# Patient Record
Sex: Female | Born: 1950 | Race: White | Hispanic: No | Smoking: Never smoker
Health system: Southern US, Community
[De-identification: ages and names within clinical notes are randomized; demographics above are authoritative.]

## PROBLEM LIST (undated history)

## (undated) DIAGNOSIS — E079 Disorder of thyroid, unspecified: Secondary | ICD-10-CM

## (undated) DIAGNOSIS — IMO0001 Reserved for inherently not codable concepts without codable children: Secondary | ICD-10-CM

## (undated) DIAGNOSIS — Z86010 Personal history of colonic polyps: Secondary | ICD-10-CM

## (undated) DIAGNOSIS — Z860101 Personal history of adenomatous and serrated colon polyps: Secondary | ICD-10-CM

## (undated) HISTORY — DX: Reserved for inherently not codable concepts without codable children: IMO0001

## (undated) HISTORY — PX: ABDOMINAL HYSTERECTOMY: SHX81

## (undated) HISTORY — DX: Personal history of colonic polyps: Z86.010

## (undated) HISTORY — DX: Personal history of adenomatous and serrated colon polyps: Z86.0101

## (undated) HISTORY — PX: COLONOSCOPY: SHX174

## (undated) HISTORY — DX: Disorder of thyroid, unspecified: E07.9

---

## 2000-03-11 ENCOUNTER — Emergency Department (HOSPITAL_COMMUNITY): Admission: EM | Admit: 2000-03-11 | Discharge: 2000-03-11 | Payer: Self-pay | Admitting: *Deleted

## 2001-01-21 ENCOUNTER — Emergency Department (HOSPITAL_COMMUNITY): Admission: EM | Admit: 2001-01-21 | Discharge: 2001-01-21 | Payer: Self-pay | Admitting: Emergency Medicine

## 2001-02-11 ENCOUNTER — Encounter: Admission: RE | Admit: 2001-02-11 | Discharge: 2001-02-11 | Payer: Self-pay | Admitting: Family Medicine

## 2001-02-11 ENCOUNTER — Encounter: Payer: Self-pay | Admitting: Family Medicine

## 2005-11-30 ENCOUNTER — Encounter: Admission: RE | Admit: 2005-11-30 | Discharge: 2005-12-09 | Payer: Self-pay | Admitting: Family Medicine

## 2005-12-10 ENCOUNTER — Encounter: Admission: RE | Admit: 2005-12-10 | Discharge: 2006-01-29 | Payer: Self-pay | Admitting: Family Medicine

## 2006-06-01 ENCOUNTER — Ambulatory Visit (HOSPITAL_COMMUNITY): Admission: RE | Admit: 2006-06-01 | Discharge: 2006-06-01 | Payer: Self-pay | Admitting: Obstetrics and Gynecology

## 2007-08-02 ENCOUNTER — Ambulatory Visit: Payer: Self-pay | Admitting: Vascular Surgery

## 2008-07-06 ENCOUNTER — Encounter: Admission: RE | Admit: 2008-07-06 | Discharge: 2008-07-06 | Payer: Self-pay | Admitting: Family Medicine

## 2011-01-24 NOTE — Consult Note (Signed)
NEW PATIENT CONSULTATION   Johnson, Mandy A  DOB:  [birth year]                                       08/02/2007  XBJYN#:[id number]   Mandy Johnson presents today for evaluation of lower extremity venous  pathology.  She is a healthy 60 year old white female with concern  regarding varicosities in both lower extremities.  She has no history of  bleeding from these.  No history of deep venous thrombosis or  superficial thrombophlebitis.  She does report some heaviness and  discomfort over the head of the thigh.  She does report some heaviness  and discomfort over the lateral aspect of her left calf with prolonged  standing.  She reports that these initially occurred during first  pregnancy, and has not had any marked progression since that time.   PAST HISTORY:  1. Hypothyroidism.  2. She does have a history of hysterectomy in 1985.   Otherwise, no major medical difficulties.   FAMILY HISTORY:  Negative for premature atherosclerotic disease.   SOCIAL HISTORY:  She is married with 2 children.  She does not smoke.  Has occasional alcohol; social consumption.   REVIEW OF SYSTEMS:  Totally negative aside from discomfort in her legs.   MEDICATION ALLERGIES:  Penicillin.   MEDICATIONS:  Levothyroxine 88 mcg daily.   PHYSICAL EXAM:  Well-developed, well-nourished white female, appearing  stated age, 60.  Blood pressure 139/91, pulse 81, respirations 16.  Her dorsalis pedis pulse in 2+ bilaterally.  She does not have any  marked varicosities in her saphenous system.  On left leg, she does have  an aberrant varicosity running across the anteromedial thigh, lateral  knee, and lateral calf.  Her left leg, she does have a prominent venous  varix in her mid posterior calf.  She does not have any evidence of  superficial thrombophlebitis.   She underwent a hand-held duplex by me.  This revealed connection of the  aberrant vein in her left thigh to her saphenous vein in the  medial  thigh.  Her saphenous vein itself does not appear to be enlarged, and I  do not see any evidence of gross reflux.  Her right leg small saphenous  vein is patent and normal size, and this varix in her right posterior  calf does arise from the small saphenous vein.  I had a long discussion  with Ms. Knab.  I explained that this does not put her at any increased  risk for progression through more serious complications related to  venous pathology; specifically deep venous thrombosis.  She currently is  having mild symptoms only related to the left lateral varix.  I  explained that if he were going to proceed with treatment, that we would  do a formal duplex to determine if she did have saphenous vein  incompetence in her left thigh, and if so would treat this with laser  ablation, followed by either stab phlebectomy or spare therapy for the  vein in her thigh.  She currently is comfortable with observation only,  and reassurance that this does not put her at increased risk for more  serious complications.  She will notify should she wish further  treatment in the future, and will be seen on an as-needed basis.   Larina Earthly, M.D.  Electronically Signed   TFE/MEDQ  D:  08/02/2007  T:  08/05/2007  Job:  [id number]   cc:   Duke Salvia. Marcelle Overlie, M.D.

## 2011-01-27 NOTE — Op Note (Signed)
[name], Mandy Johnson                   ACCOUNT NO.:  [id number]   MEDICAL RECORD NO.:  [id number]          PATIENT TYPE:  AMB   LOCATION:  [facility]                           FACILITY:  [facility]   PHYSICIAN:  Duke Salvia. Marcelle Overlie, M.D.DATE OF BIRTH:  11-Dec-1950   DATE OF PROCEDURE:  06/01/2006  DATE OF DISCHARGE:  06/01/2006                                 OPERATIVE REPORT   PREOPERATIVE DIAGNOSIS:  Recurrent right Bartholin cyst/abscess.   POSTOPERATIVE DIAGNOSIS:  Recurrent right Bartholin cyst/abscess.   PROCEDURE:  Marsupialization of right Bartholin gland cyst/abscess.   SURGEON:  Duke Salvia. Marcelle Overlie, M.D.   ANESTHESIA:  Local plus MAC.   SPECIMENS REMOVED:  None.   PROCEDURE AND FINDINGS:  The patient was taken to the operating room.  After  an adequate level of sedation was obtained with the patient legs in  stirrups, the perineum and vagina were prepped and draped with Hibiclens.  The right Bartholin's was noted be enlarged approximately 3 cm.  It had been  previously drained with a Word catheter placed.  The incision site further  for the Word catheter placement could be seen to be draining small amount of  mucopurulent material.  Marcaine 0.5% was used to infiltrate the area.  A  #11 blade was used to drain the cyst, which also had some minimal purulent  material left over.  This was scored with a hemostat.  The cyst wall was  then circumferentially sutured with 3-0 Vicryl Rapide sutures to the  surrounding skin.  Plain gauze was packed into the incision site, and she  will receive oral antibiotics to take at home for a few days.  She tolerated  this well and went to the recovery room in good condition.      Richard M. Marcelle Overlie, M.D.  Electronically Signed     RMH/MEDQ  D:  06/01/2006  T:  06/03/2006  Job:  [id number]

## 2011-12-05 ENCOUNTER — Encounter: Payer: Self-pay | Admitting: *Deleted

## 2011-12-06 ENCOUNTER — Encounter: Payer: Self-pay | Admitting: Vascular Surgery

## 2011-12-06 ENCOUNTER — Ambulatory Visit (INDEPENDENT_AMBULATORY_CARE_PROVIDER_SITE_OTHER): Payer: BC Managed Care – PPO | Admitting: *Deleted

## 2011-12-06 DIAGNOSIS — I83893 Varicose veins of bilateral lower extremities with other complications: Secondary | ICD-10-CM

## 2011-12-06 NOTE — Progress Notes (Signed)
Patient came for sclerotherapy but presented with a large reticular vein that branches off the left GSV (per Dr. Bosie Helper note from a previous visit). I showed Dr. Edilia Bo the vein and he suggested she have a duplex ultrasound to rule out reflux. The patient is much more comfortable doing this vs. Injecting and having more veins appear later. She does have pain, itching , and heaviness in the left leg more than the right. The right leg also has a reticular that goes up the side of her leg from the calf to the thigh. She also reports that she had a  Bleeding episode recently that occurred on the left in the middle of the large reticular and presented spontaneously, and as a large, painful bruise. She doesn't want to waste money on doing sclerotherapy if there is reflux. I fitted her for thigh high graduated compression 20-30 mmHG stockings today and she will have the reflux study done next Monday 12/11/11.

## 2011-12-11 ENCOUNTER — Encounter (INDEPENDENT_AMBULATORY_CARE_PROVIDER_SITE_OTHER): Payer: BC Managed Care – PPO | Admitting: *Deleted

## 2011-12-11 DIAGNOSIS — I83893 Varicose veins of bilateral lower extremities with other complications: Secondary | ICD-10-CM

## 2011-12-18 NOTE — Procedures (Unsigned)
LOWER EXTREMITY VENOUS REFLUX EXAM  INDICATION:  Varicose vein with other complication.  EXAM:  Using color-flow imaging and pulse Doppler spectral analysis, the bilateral common femoral, femoral, popliteal, posterior tibial, great and small saphenous veins were evaluated.  There is evidence suggesting deep venous insufficiency in the bilateral common femoral veins.  The bilateral saphenofemoral junctions and non-tortuous great saphenous veins demonstrate competency.  The right anterior accessory saphenous vein demonstrates Reflux of>500 milliseconds with diameter measurements ranging from 0.31 to 0.35 cm in the proximal to mid thigh level.  The bilateral proximal small saphenous veins demonstrate competency.  GSV Diameter (used if found to be incompetent only)                                           Right    Left Proximal Greater Saphenous Vein           cm       cm Proximal-to-mid-thigh                     cm       cm Mid thigh                                 cm       cm Mid-distal thigh                          cm       cm Distal thigh                              cm       cm Knee                                      cm       cm  IMPRESSION: 1. Reflux of>500 milliseconds noted in the right anterior accessory     saphenous and bilateral common femoral veins. 2. No reflux is noted in the bilateral great and small saphenous     veins.  ___________________________________________ Quita Skye Hart [place], M.D.  CH/MEDQ  D:  12/13/2011  T:  12/13/2011  Job:  [id number]

## 2011-12-19 ENCOUNTER — Encounter: Payer: Self-pay | Admitting: *Deleted

## 2011-12-20 ENCOUNTER — Ambulatory Visit (INDEPENDENT_AMBULATORY_CARE_PROVIDER_SITE_OTHER): Payer: BC Managed Care – PPO | Admitting: *Deleted

## 2011-12-20 DIAGNOSIS — I781 Nevus, non-neoplastic: Secondary | ICD-10-CM

## 2011-12-20 DIAGNOSIS — I83893 Varicose veins of bilateral lower extremities with other complications: Secondary | ICD-10-CM

## 2011-12-20 NOTE — Progress Notes (Signed)
X=>5% for large reticulars and >3% for spiders Sotradecol administered with a 27g butterfly.  Patient received a total of 24cc. Patient had her Duplex which showed some reflux in both deep systems. Both GSVs are competent. Right ant accessory saphenous has reflux but vein diameter is small. Not a candidate for laser ablation. Is a candidate for sclero. Treated all her retics and spiders. Tol well. Anticipate good results for this nice lady. Will follow prn.  Photos: yes  Compression stockings applied: yes and aces over retics.

## 2011-12-21 ENCOUNTER — Encounter: Payer: Self-pay | Admitting: Vascular Surgery

## 2012-04-16 ENCOUNTER — Other Ambulatory Visit: Payer: Self-pay | Admitting: Obstetrics and Gynecology

## 2012-04-16 DIAGNOSIS — R928 Other abnormal and inconclusive findings on diagnostic imaging of breast: Secondary | ICD-10-CM

## 2012-04-22 ENCOUNTER — Ambulatory Visit
Admission: RE | Admit: 2012-04-22 | Discharge: 2012-04-22 | Disposition: A | Discharge status: Home / Self Care | Payer: BC Managed Care – PPO | Source: Ambulatory Visit | Attending: Obstetrics and Gynecology | Admitting: Obstetrics and Gynecology

## 2012-04-22 DIAGNOSIS — R928 Other abnormal and inconclusive findings on diagnostic imaging of breast: Secondary | ICD-10-CM

## 2012-10-08 ENCOUNTER — Encounter: Payer: Self-pay | Admitting: *Deleted

## 2012-10-09 ENCOUNTER — Ambulatory Visit (INDEPENDENT_AMBULATORY_CARE_PROVIDER_SITE_OTHER): Payer: BC Managed Care – PPO | Admitting: *Deleted

## 2012-10-09 DIAGNOSIS — I781 Nevus, non-neoplastic: Secondary | ICD-10-CM

## 2012-10-09 NOTE — Progress Notes (Signed)
X=.7% Sotradecol administered with a 27g butterfly.  Patient received a total of 6cc.  She only wanted me to inject the large reticular on the left lateral calf. Increased the concentration again (.5% last time). Easy access. Vein is large. Pt very realistic. Follow prn.  Photos: yes  Compression stockings applied: yes and an ace

## 2012-11-13 ENCOUNTER — Other Ambulatory Visit: Payer: Self-pay | Admitting: Obstetrics and Gynecology

## 2012-11-13 DIAGNOSIS — R921 Mammographic calcification found on diagnostic imaging of breast: Secondary | ICD-10-CM

## 2012-12-05 ENCOUNTER — Ambulatory Visit
Admission: RE | Admit: 2012-12-05 | Discharge: 2012-12-05 | Disposition: A | Discharge status: Home / Self Care | Payer: BC Managed Care – PPO | Source: Ambulatory Visit | Attending: Obstetrics and Gynecology | Admitting: Obstetrics and Gynecology

## 2012-12-05 DIAGNOSIS — R921 Mammographic calcification found on diagnostic imaging of breast: Secondary | ICD-10-CM

## 2013-09-01 ENCOUNTER — Other Ambulatory Visit: Payer: Self-pay | Admitting: Obstetrics and Gynecology

## 2013-09-01 DIAGNOSIS — R921 Mammographic calcification found on diagnostic imaging of breast: Secondary | ICD-10-CM

## 2013-09-15 ENCOUNTER — Ambulatory Visit
Admission: RE | Admit: 2013-09-15 | Discharge: 2013-09-15 | Disposition: A | Discharge status: Home / Self Care | Payer: BC Managed Care – PPO | Source: Ambulatory Visit | Attending: Obstetrics and Gynecology | Admitting: Obstetrics and Gynecology

## 2013-09-15 DIAGNOSIS — R921 Mammographic calcification found on diagnostic imaging of breast: Secondary | ICD-10-CM

## 2013-11-18 ENCOUNTER — Encounter: Payer: Self-pay | Admitting: *Deleted

## 2013-11-19 ENCOUNTER — Ambulatory Visit (INDEPENDENT_AMBULATORY_CARE_PROVIDER_SITE_OTHER): Payer: Self-pay | Admitting: *Deleted

## 2013-11-19 DIAGNOSIS — I781 Nevus, non-neoplastic: Secondary | ICD-10-CM

## 2013-11-19 NOTE — Progress Notes (Signed)
X=.3% Sotradecol administered with a 27g butterfly.  Patient received a total of 5cc.  Treated new vessels. She only wanted one syringe. Great results since previous tx of her reticulars. She's very happy. Tol well. Anticipate good results. Follow prn.  Photos: yes  Compression stockings applied: yes

## 2014-11-30 ENCOUNTER — Other Ambulatory Visit: Payer: Self-pay

## 2014-11-30 DIAGNOSIS — Z1231 Encounter for screening mammogram for malignant neoplasm of breast: Secondary | ICD-10-CM

## 2014-12-03 ENCOUNTER — Ambulatory Visit
Admission: RE | Admit: 2014-12-03 | Discharge: 2014-12-03 | Disposition: A | Discharge status: Home / Self Care | Payer: BLUE CROSS/BLUE SHIELD | Source: Ambulatory Visit

## 2014-12-03 DIAGNOSIS — Z1231 Encounter for screening mammogram for malignant neoplasm of breast: Secondary | ICD-10-CM

## 2015-10-07 ENCOUNTER — Other Ambulatory Visit: Payer: Self-pay

## 2015-10-07 DIAGNOSIS — Z1231 Encounter for screening mammogram for malignant neoplasm of breast: Secondary | ICD-10-CM

## 2015-12-06 ENCOUNTER — Ambulatory Visit
Admission: RE | Admit: 2015-12-06 | Discharge: 2015-12-06 | Disposition: A | Discharge status: Home / Self Care | Payer: Medicare Other | Source: Ambulatory Visit

## 2015-12-06 DIAGNOSIS — Z1231 Encounter for screening mammogram for malignant neoplasm of breast: Secondary | ICD-10-CM | POA: Diagnosis not present

## 2015-12-21 DIAGNOSIS — N951 Menopausal and female climacteric states: Secondary | ICD-10-CM | POA: Diagnosis not present

## 2015-12-21 DIAGNOSIS — E039 Hypothyroidism, unspecified: Secondary | ICD-10-CM | POA: Diagnosis not present

## 2015-12-21 DIAGNOSIS — Z23 Encounter for immunization: Secondary | ICD-10-CM | POA: Diagnosis not present

## 2015-12-21 DIAGNOSIS — E7800 Pure hypercholesterolemia, unspecified: Secondary | ICD-10-CM | POA: Diagnosis not present

## 2015-12-21 DIAGNOSIS — Z0000 Encounter for general adult medical examination without abnormal findings: Secondary | ICD-10-CM | POA: Diagnosis not present

## 2016-01-11 DIAGNOSIS — M8589 Other specified disorders of bone density and structure, multiple sites: Secondary | ICD-10-CM | POA: Diagnosis not present

## 2016-01-11 DIAGNOSIS — Z780 Asymptomatic menopausal state: Secondary | ICD-10-CM | POA: Diagnosis not present

## 2016-03-10 DIAGNOSIS — H40011 Open angle with borderline findings, low risk, right eye: Secondary | ICD-10-CM | POA: Diagnosis not present

## 2016-03-10 DIAGNOSIS — H40012 Open angle with borderline findings, low risk, left eye: Secondary | ICD-10-CM | POA: Diagnosis not present

## 2016-06-16 DIAGNOSIS — Z23 Encounter for immunization: Secondary | ICD-10-CM | POA: Diagnosis not present

## 2016-06-23 DIAGNOSIS — E7800 Pure hypercholesterolemia, unspecified: Secondary | ICD-10-CM | POA: Diagnosis not present

## 2016-08-09 ENCOUNTER — Encounter: Payer: Self-pay | Admitting: *Deleted

## 2016-08-16 ENCOUNTER — Other Ambulatory Visit: Payer: Self-pay | Admitting: *Deleted

## 2016-08-16 ENCOUNTER — Ambulatory Visit: Payer: Medicare Other | Admitting: *Deleted

## 2016-08-16 DIAGNOSIS — I83893 Varicose veins of bilateral lower extremities with other complications: Secondary | ICD-10-CM

## 2016-08-16 NOTE — Progress Notes (Signed)
Patient came in for her 4th sclerotherapy session with me. Her last being in 2015 where initially she had good results but new spiders have shown up and now there is a large bulging varicose vein which runs from her left inner thigh over her knee and down the left outer side. Looking with the sonosite, reflux was seen in the L GSV. Dr. Scot Dock suggested a bilateral reflux study since there are new bulges on the right as well and a visit with Dr. Kellie Simmering. We discussed beginning the three months of conservative therapy which includes wearing her thigh high 20-30 compression stockings, elevation and Ibuprofen. Pt has minimal swelling presently but does have heaviness and pain especially in the left leg. Her CEAP classification is 2. Appts made for 09/18/16. Will follow prn.

## 2016-08-17 NOTE — Addendum Note (Signed)
Addended by: Lianne Cure A on: 08/17/2016 10:07 AM   Modules accepted: Orders

## 2016-09-01 ENCOUNTER — Encounter: Payer: Self-pay | Admitting: Vascular Surgery

## 2016-09-18 ENCOUNTER — Ambulatory Visit (HOSPITAL_COMMUNITY): Admission: RE | Admit: 2016-09-18 | Payer: Medicare Other | Source: Ambulatory Visit

## 2016-09-18 ENCOUNTER — Encounter: Payer: Medicare Other | Admitting: Vascular Surgery

## 2016-09-26 ENCOUNTER — Encounter: Payer: Self-pay | Admitting: Vascular Surgery

## 2016-10-02 ENCOUNTER — Ambulatory Visit (HOSPITAL_COMMUNITY)
Admission: RE | Admit: 2016-10-02 | Discharge: 2016-10-02 | Disposition: A | Discharge status: Home / Self Care | Payer: Medicare Other | Source: Ambulatory Visit | Attending: Vascular Surgery | Admitting: Vascular Surgery

## 2016-10-02 ENCOUNTER — Ambulatory Visit (INDEPENDENT_AMBULATORY_CARE_PROVIDER_SITE_OTHER): Payer: Medicare Other | Admitting: Vascular Surgery

## 2016-10-02 ENCOUNTER — Encounter: Payer: Self-pay | Admitting: Vascular Surgery

## 2016-10-02 VITALS — BP 127/77 | HR 62 | Temp 98.6°F | Resp 16 | Ht 69.0 in | Wt 150.0 lb

## 2016-10-02 DIAGNOSIS — I83893 Varicose veins of bilateral lower extremities with other complications: Secondary | ICD-10-CM | POA: Insufficient documentation

## 2016-10-02 DIAGNOSIS — I8393 Asymptomatic varicose veins of bilateral lower extremities: Secondary | ICD-10-CM | POA: Insufficient documentation

## 2016-10-02 DIAGNOSIS — I872 Venous insufficiency (chronic) (peripheral): Secondary | ICD-10-CM | POA: Diagnosis not present

## 2016-10-02 NOTE — Progress Notes (Signed)
Subjective:     Patient ID: Mandy Johnson, female   DOB: [birth year], 66 y.o.   MRN: [id number]  HPI This 66 year old female has been previously seen in our office by Thea Silversmith in 2015. She had 3 courses of sclerotherapy for spider veins in both lower extremities. She had an excellent early results. Recently when she was doing some yardwork she noticed a bulge in the left patellar region which happened suddenly she states. She has no history of DVT thrombophlebitis stasis ulcers or bleeding. She's also noticed increasing spider veins in the left leg. She has a bulge in the right posterior calf as well. She is concerned about whether this could cause blood clots.  Past Medical History:  Diagnosis Date  . Thyroid disease   . 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 Smoking status: Never Smoker  . Smokeless tobacco: Never Used  . Alcohol use Yes    Family History  Problem Relation Age of Onset  . Cancer Mother   . Heart disease Father     Allergies  Allergen Reactions  . Iodine Rash  . Penicillins Rash     Current Outpatient Prescriptions:  .  levothyroxine (SYNTHROID, LEVOTHROID) 100 MCG tablet, Take 100 mcg by mouth daily before breakfast., Disp: , Rfl:   Vitals:   10/02/16 1141  BP: 127/77  Pulse: 62  Resp: 16  Temp: 98.6 F (37 C)  SpO2: 100%  Weight: 150 lb (68 kg)  Height: 5\' 9"  (1.753 m)    Body mass index is 22.15 kg/m.         Review of Systems Denies chest pain, dyspnea on exertion, PND, orthopnea, hemoptysis. Does have tinnitus which causes occasional dizziness. No history of CVA. Other systems negative and complete review of systems    Objective:   Physical Exam BP 127/77   Pulse 62   Temp 98.6 F (37 C)   Resp 16   Ht 5\' 9"  (1.753 m)   Wt 150 lb (68 kg)   SpO2 100%   BMI 22.15 kg/m     Gen.-alert and oriented x3 in no apparent distress HEENT normal for age Lungs no rhonchi or  wheezing Cardiovascular regular rhythm no murmurs carotid pulses 3+ palpable no bruits audible Abdomen soft nontender no palpable masses Musculoskeletal free of  major deformities Skin clear -no rashes Neurologic normal Lower extremities 3+ femoral and dorsalis pedis pulses palpable bilaterally with no edema Right leg with focal varicosity mid posterior calf about one half centimeters in diameter. A few spider veins right medial and lateral thigh Left leg with varicosity beginning in distal thigh extending down across patella. Scattered spider veins medial and lateral thigh and calf on the left. No hyperpigmentation or ulceration or edema on either lower extremity.  Today I ordered a venous duplex exam the left leg which I reviewed and interpreted. There is gross reflux in the left great saphenous vein but it is a small caliber vein. It does supply the varicosity in the left distal thigh. I performed a bedside SonoSite independent ultrasound exam. The right small saphenous vein is very small and it appears that the isolated varix is originating from an incompetent perforator. The left great saphenous vein does appear to small for laser ablation       Assessment:     #1 scattered spider veins and a few small varicosities and right posterior calf and left distal thigh and  patellar region #2 history of tinnitus    Plan:     Patient not a candidate for laser ablation of great or small saphenous veins Treatment option would be foam sclerotherapy. We did discuss this treatment option with her and she will consider it Continue treatment of 10 of this as she has been doingpre- scribed by her medical doctor Otherwise return to see me on a when necessary basis

## 2016-10-11 ENCOUNTER — Other Ambulatory Visit: Payer: Self-pay | Admitting: Family Medicine

## 2016-10-11 DIAGNOSIS — Z1231 Encounter for screening mammogram for malignant neoplasm of breast: Secondary | ICD-10-CM

## 2016-11-01 ENCOUNTER — Encounter: Payer: Self-pay | Admitting: *Deleted

## 2016-11-08 ENCOUNTER — Ambulatory Visit: Payer: Medicare Other | Admitting: *Deleted

## 2016-11-08 DIAGNOSIS — I83893 Varicose veins of bilateral lower extremities with other complications: Secondary | ICD-10-CM

## 2016-11-08 NOTE — Progress Notes (Signed)
Patient came for sclerotherapy but had concerns whether or not this is the best treatment for her veins. She came 5 weeks ago for a reflux study that showed reflux in the L GSV but the vein diameter was .39 and .37cm in the proximal and mid thigh, so too small for insurance approval of a laser ablation. She says her varicose veins that run across her knee and down the calf continue to get bigger and the discomfort is worsening. She would like to repeat the study in 6 months to see if the vein diameter has increased. She wears thigh high graduated compression stockings daily, tries to elevate when possible and takes Ibuprofen when the pain is really bad. I told her to continue with that conservative therapy. Appts were made for the repeat reflux study and a visit with Dr. Donnetta Hutching (because his schedule is a bit more open then Dr. Evelena Leyden). We decided to postpone her sclerotherapy appt until after she sees Dr. Donnetta Hutching.

## 2016-11-14 NOTE — Addendum Note (Signed)
Addended by: Lianne Cure A on: 11/14/2016 03:01 PM   Modules accepted: Orders

## 2016-12-06 ENCOUNTER — Ambulatory Visit: Payer: Medicare Other

## 2016-12-06 ENCOUNTER — Ambulatory Visit
Admission: RE | Admit: 2016-12-06 | Discharge: 2016-12-06 | Disposition: A | Discharge status: Home / Self Care | Payer: Medicare Other | Source: Ambulatory Visit | Attending: Family Medicine | Admitting: Family Medicine

## 2016-12-06 DIAGNOSIS — Z1231 Encounter for screening mammogram for malignant neoplasm of breast: Secondary | ICD-10-CM

## 2016-12-20 ENCOUNTER — Ambulatory Visit: Payer: Medicare Other | Admitting: *Deleted

## 2016-12-26 DIAGNOSIS — Z0000 Encounter for general adult medical examination without abnormal findings: Secondary | ICD-10-CM | POA: Diagnosis not present

## 2016-12-26 DIAGNOSIS — E039 Hypothyroidism, unspecified: Secondary | ICD-10-CM | POA: Diagnosis not present

## 2016-12-26 DIAGNOSIS — R42 Dizziness and giddiness: Secondary | ICD-10-CM | POA: Diagnosis not present

## 2016-12-26 DIAGNOSIS — E7800 Pure hypercholesterolemia, unspecified: Secondary | ICD-10-CM | POA: Diagnosis not present

## 2016-12-26 DIAGNOSIS — Z23 Encounter for immunization: Secondary | ICD-10-CM | POA: Diagnosis not present

## 2017-01-02 ENCOUNTER — Ambulatory Visit: Payer: Medicare Other | Admitting: Vascular Surgery

## 2017-01-04 ENCOUNTER — Ambulatory Visit: Payer: Medicare Other | Attending: Family Medicine | Admitting: Rehabilitative and Restorative Service Providers"

## 2017-01-04 DIAGNOSIS — R2681 Unsteadiness on feet: Secondary | ICD-10-CM

## 2017-01-04 DIAGNOSIS — R2689 Other abnormalities of gait and mobility: Secondary | ICD-10-CM | POA: Insufficient documentation

## 2017-01-04 DIAGNOSIS — R42 Dizziness and giddiness: Secondary | ICD-10-CM | POA: Insufficient documentation

## 2017-01-04 DIAGNOSIS — H8112 Benign paroxysmal vertigo, left ear: Secondary | ICD-10-CM | POA: Insufficient documentation

## 2017-01-04 NOTE — Patient Instructions (Signed)
Gaze Stabilization - Tip Card  1.Target must remain in focus, not blurry, and appear stationary while head is in motion. 2.Perform exercises with small head movements (45 to either side of midline). 3.Increase speed of head motion so long as target is in focus. 4.If you wear eyeglasses, be sure you can see target through lens (therapist will give specific instructions for bifocal / progressive lenses). 5.These exercises may provoke dizziness or nausea. Work through these symptoms. If too dizzy, slow head movement slightly. Rest between each exercise. 6.Exercises demand concentration; avoid distractions. 7.For safety, perform standing exercises close to a counter, wall, corner, or next to someone.  Copyright  VHI. All rights reserved.   Gaze Stabilization - Standing Feet Apart   Feet shoulder width apart, keeping eyes on target on wall 3 feet away, tilt head down slightly and move head side to side for 30 seconds. Repeat while moving head up and down for 30 seconds. *Work up to tolerating 60 seconds, as able. Do 2-3 sessions per day.   Copyright  VHI. All rights reserved.   Feet Heel-Toe "Tandem", Varied Arm Positions - Eyes Open    With eyes open, right foot directly in front of the other, arms out, look straight ahead at a stationary object. Hold __30__ seconds and then switch feet.  Repeat _3___ times per session. Do __2__ sessions per day.  Copyright  VHI. All rights reserved.   Feet Apart (Compliant Surface) Varied Arm Positions - Eyes Closed    Stand on compliant surface: __pillow______ with feet shoulder width apart and arms out. Close eyes and visualize upright position. Hold_10-15___ seconds. Repeat __3__ times per session. Do __2__ sessions per day.  Copyright  VHI. All rights reserved.

## 2017-01-04 NOTE — Therapy (Signed)
[facility] [address] [place], [place], [address] Phone: [phone]   Fax:  [phone]  Physical Therapy Treatment  Patient Details  Name: Mandy Johnson MRN: [id number] Date of Birth: Aug 29, 1951 Referring Provider: Donnie Coffin, MD  Encounter Date: 01/04/2017      PT End of Session - 01/04/17 1617    Visit Number 1   Number of Visits 8   Date for PT Re-Evaluation 03/05/17   Authorization Type G code every 10th visit   PT Start Time 0933   PT Stop Time 1020   PT Time Calculation (min) 47 min   Activity Tolerance Patient tolerated treatment well   Behavior During Therapy [facility] for tasks assessed/performed      Past Medical History:  Diagnosis Date  . Thyroid disease   . 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 ABDOMINAL HYSTERECTOMY      There were no vitals filed for this visit.      Subjective Assessment - 01/04/17 0938    Subjective The patient notes ongoing sensation of vertigo with first episode in her early 66s (approximately 15 years ago).  She said the initial symptoms she worked out, but it took years.  She reports she used to feel like she was walking on a slant and that has improved.  "Now it's part of my life."   The symptoms are keeping her from working "I'm always aware of it".  Her last attack was 2.5 years ago and she was lying on her left side--it lasted for seconds, but it left her feeling off balance and unstable.  She no longer sleeps lying down.  Sensation overall lasted about a week and she had to avoid driving/getting out of her house.    Pertinent History h/o goiter, thyroid disease, some mild discomfort behind left ear.   H/o migraines since she was 11 (visual aura lasts 20-45 minutes, headache can last a day--has to sleep for it to go away).  Improved with age/  notes some fogginess x hours.    Patient Stated Goals Figure out some  exercises to help improve dizziness.    Currently in Pain? No/denies            [facility] PT Assessment - 01/04/17 0943      Assessment   Medical Diagnosis vertigo   Referring Provider Donnie Coffin, MD   Onset Date/Surgical Date --  chronic history, worse over 2.5 years ago   Prior Therapy none     Precautions   Precautions None     Restrictions   Weight Bearing Restrictions No     Balance Screen   Has the patient fallen in the past 6 months No   Has the patient had a decrease in activity level because of a fear of falling?  Yes  modifies activities based on symptoms   Is the patient reluctant to leave their home because of a fear of falling?  No     Home [employer] residence     Prior Function   Level of Independence Independent   Leisure exercises: hiking, LE exercise, lifting weights     Cognition   Overall Cognitive Status Within Functional Limits for tasks assessed     Observation/Other Assessments   Focus on Therapeutic Outcomes (FOTO)  77%   Other Surveys  Other Surveys   Dizziness Handicap Inventory [facility])  48%  High Level Balance   High Level Balance Comments Patient unable to hold tandem stand on either side for > 5 seconds without external support (eyes open).  Standing feet togethe + eyes closed.              Vestibular Assessment - 01/04/17 0946      Vestibular Assessment   General Observation Patient walks into clinic without a device independently.      Symptom Behavior   Type of Dizziness Spinning   Frequency of Dizziness anytime in left sidelying   Duration of Dizziness seconds, but has lasting effects x 1 week   Aggravating Factors Turning head quickly   Relieving Factors Head stationary     Occulomotor Exam   Occulomotor Alignment Normal  notes cateract L eye   Spontaneous Absent   Gaze-induced Absent   Smooth Pursuits Saccades  noted saccadic pursuit in vertical plane only   Saccades Dysmetria   noted 2 jumps to get to target in vertical plane only   Comment Wears glasses for distance.   Convergence= eyes move symmetrically     Vestibulo-Occular Reflex   VOR 1 Head Only (x 1 viewing) Slow VOR maintains fixation without symptoms in horizontal plane.  Vertical VOR provokes a sensation of visual blurring.   VOR Cancellation Corrective saccades  mild catch up saccades noted   Comment Head impulse test=large amplitude corrective saccade to target, WNLs on right *INDICATES DIMINISHED VOR*     Visual Acuity   Static line 7   Dynamic line 4  3 line difference INDICATES DIMINISHED VOR     Positional Testing   Sidelying Test Sidelying Right;Sidelying Left   Horizontal Canal Testing Horizontal Canal Right;Horizontal Canal Left    *positional testing deferred due to patient request--she would like to perform when daughter able to drive her.             [facility] Adult PT Treatment/Exercise - 01/04/17 0001      Neuro Re-ed    Neuro Re-ed Details  Corner balance HEP and gaze x 1 viewing for adaptation initiated at today's session.                PT Education - 01/04/17 1616    Education provided Yes   Education Details HEP: gaze x 1 viewing, pillow with eyes closed,tandem standing   Person(s) Educated Patient   Methods Explanation;Demonstration;Handout   Comprehension Verbalized understanding;Returned demonstration          PT Short Term Goals - 01/04/17 1617      PT SHORT TERM GOAL #1   Title The patient will be indep with HEP for gaze x 1 viewing, habituation and balance.   Baseline Target date 02/02/2017   Time 4   Period Weeks     PT SHORT TERM GOAL #2   Title The patient will tolerate gaze x 1 viewing x 30 seconds horiz/vertical planes without subjective c/o visual blurring at self regulated pace.   Baseline Target date 02/02/17   Time 4   Period Weeks     PT SHORT TERM GOAL #3   Title The patient will tolerate positional testing to identify L BPPV  further (wants to have her daughter drive her to therapy due to intolerance for L sidelying)   Baseline Target date 02/02/17   Time 4   Period Weeks           PT Long Term Goals - 01/04/17 1652      PT LONG TERM  GOAL #1   Title The patient will be independent in progression of HEP.   Baseline Target date 03/06/17   Time 8   Period Weeks     PT LONG TERM GOAL #2   Title The patient will improve DHI from 48% to < or equal to 32% to demo reduced self perception of dizziness.   Baseline Target date 03/06/17   Time 8   Period Weeks     PT LONG TERM GOAL #3   Title The patient will have a 2 line difference in static vs dynamic visual acuity to demo improving use of VOR.   Baseline Target date 03/06/17   Time 8   Period Weeks     PT LONG TERM GOAL #4   Title The patient will tolerate lying sit>L sidelying without reports of dizziness/vertigo.   Baseline Target date 03/06/17   Time 8   Period Weeks     PT LONG TERM GOAL #5   Title The patient will maintain standing feet together + eyes closed x 30 seconds on compliant surfaces to demonstrate improving multi-sensory balance.   Baseline Target date 03/06/17   Time 8   Period Weeks               Plan - 01/04/17 1657    Clinical Impression Statement The patient is a 66 year old female with h/o chronic, intermittent vertigo that varies in intensity.  She reports symptoms consistent with L BPPV, however did not want to test for positional vertigo today due to needing to drive home.  She does present with symptoms consistent with L hypofunction per positive L head impulse test, decreased static versus dynamic visual acuity, motion sensitivity, and imbalance.  Patient reports a h/o migraines, which may be making vestibular adaptation/compensation more challenging.  PT to focus on improving gaze, reducing motion sensitivity, improving balance, increasing physical activity, and reducing behaviors that decrease her compensation (avoiding  lying flat, staying in more often, etc).     Rehab Potential Good   PT Frequency 1x / week   PT Duration 8 weeks   PT Treatment/Interventions ADLs/Self Care Home Management;Therapeutic activities;Therapeutic exercise;Balance training;Neuromuscular re-education;Gait training;Functional mobility training;Patient/family education;Vestibular;Canalith Repostioning   PT Next Visit Plan Check positional vertigo; progress HEP, perform SOT?, dynamic gait/balance.   Consulted and Agree with Plan of Care Patient      Patient will benefit from skilled therapeutic intervention in order to improve the following deficits and impairments:  Abnormal gait, Difficulty walking, Dizziness, Decreased mobility, Decreased balance, Decreased activity tolerance  Visit Diagnosis: BPPV (benign paroxysmal positional vertigo), left  Dizziness and giddiness  Other abnormalities of gait and mobility  Unsteadiness on feet     Problem List Patient Active Problem List   Diagnosis Date Noted  . Spider veins of both lower extremities 10/02/2016  . Spider vein, symptomatic 10/09/2012  . Varicose veins of bilateral lower extremities with other complications 15/40/0867    Vittoria Noreen, PT 01/04/2017, 5:01 PM  [facility] [address] [place], [place], [address] Phone: [phone]   Fax:  [phone]  Name: PAYSLEY POPLAR MRN: [id number] Date of Birth: [birth year]

## 2017-01-17 ENCOUNTER — Ambulatory Visit: Payer: Medicare Other | Attending: Family Medicine | Admitting: Rehabilitative and Restorative Service Providers"

## 2017-01-17 DIAGNOSIS — H8112 Benign paroxysmal vertigo, left ear: Secondary | ICD-10-CM | POA: Diagnosis not present

## 2017-01-17 DIAGNOSIS — R2681 Unsteadiness on feet: Secondary | ICD-10-CM | POA: Diagnosis not present

## 2017-01-17 DIAGNOSIS — R2689 Other abnormalities of gait and mobility: Secondary | ICD-10-CM | POA: Diagnosis not present

## 2017-01-17 DIAGNOSIS — R42 Dizziness and giddiness: Secondary | ICD-10-CM | POA: Diagnosis not present

## 2017-01-17 NOTE — Therapy (Signed)
[facility] [address] [place], [place], [address] Phone: [phone]   Fax:  [phone]  Physical Therapy Treatment  Patient Details  Name: Mandy Johnson MRN: [id number] Date of Birth: 29-Sep-1950 Referring Provider: Donnie Coffin, MD  Encounter Date: 01/17/2017      PT End of Session - 01/17/17 1154    Visit Number 2   Number of Visits 8   Date for PT Re-Evaluation 03/05/17   Authorization Type G code every 10th visit   PT Start Time 1100   PT Stop Time 1150   PT Time Calculation (min) 50 min   Activity Tolerance Patient tolerated treatment well   Behavior During Therapy [facility] for tasks assessed/performed      Past Medical History:  Diagnosis Date  . Thyroid disease   . 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 ABDOMINAL HYSTERECTOMY      There were no vitals filed for this visit.      Subjective Assessment - 01/17/17 1104    Subjective The patient is avoiding lying down on her left side x years.  She has been doing HEP and notes improvement with balance and movement.    Pertinent History h/o goiter, thyroid disease, some mild discomfort behind left ear.   H/o migraines since she was 11 (visual aura lasts 20-45 minutes, headache can last a day--has to sleep for it to go away).  Improved with age/  notes some fogginess x hours.    Patient Stated Goals Figure out some exercises to help improve dizziness.    Currently in Pain? No/denies                Vestibular Assessment - 01/17/17 1107      Vestibular Assessment   General Observation Patient brought      Positional Testing   Dix-Hallpike Dix-Hallpike Right;Dix-Hallpike Left   Sidelying Test Sidelying Right;Sidelying Left   Horizontal Canal Testing Horizontal Canal Right;Horizontal Canal Left     Dix-Hallpike Right   Dix-Hallpike Right Duration none   Dix-Hallpike Right Symptoms No  nystagmus     Dix-Hallpike Left   Dix-Hallpike Left Duration none   Dix-Hallpike Left Symptoms No nystagmus     Sidelying Right   Sidelying Right Duration sensation spinning could start with no true nystagmus (had 3 seconds in which eyes did a quick movement, but no a true nystagmus)   Sidelying Right Symptoms No nystagmus     Sidelying Left   Sidelying Left Duration none   Sidelying Left Symptoms No nystagmus     Horizontal Canal Right   Horizontal Canal Right Duration none   Horizontal Canal Right Symptoms Normal     Horizontal Canal Left   Horizontal Canal Left Duration none   Horizontal Canal Left Symptoms Normal                 [facility] Adult PT Treatment/Exercise - 01/17/17 1428      Neuro Re-ed    Neuro Re-ed Details  Corner balance activities adding foam standing with horizontal head motion and vertical head motion x 10 reps.           Vestibular Treatment/Exercise - 01/17/17 1126      Vestibular Treatment/Exercise   Vestibular Treatment Provided Habituation;Gaze   Habituation Exercises Horizontal Roll;Seated Horizontal Head Turns;Seated Vertical Head Turns   Gaze Exercises X1 Viewing Horizontal;X1 Viewing Vertical     Horizontal  Roll   Number of Reps  3   Symptom Description  Patient initially guarded and hesitant to perform. She tolerated well without true vertigo.  She noted it felt like it could come one, but it did not begin.     Seated Horizontal Head Turns   Number of Reps  5   Symptom Description  no dizziness today, reported "some days this would make me dizzy"     Seated Vertical Head Turns   Number of Reps  5   Symptom Description  none     X1 Viewing Horizontal   Foot Position standing   Comments with cues on technique and visual fixation     X1 Viewing Vertical   Foot Position standing   Comments with cues on technique               PT Education - 01/17/17 1144    Education provided Yes   Education Details HEP:  updated  horizontal rolling, head motion.   Person(s) Educated Patient   Methods Explanation;Demonstration;Handout   Comprehension Verbalized understanding;Returned demonstration          PT Short Term Goals - 01/04/17 1617      PT SHORT TERM GOAL #1   Title The patient will be indep with HEP for gaze x 1 viewing, habituation and balance.   Baseline Target date 02/02/2017   Time 4   Period Weeks     PT SHORT TERM GOAL #2   Title The patient will tolerate gaze x 1 viewing x 30 seconds horiz/vertical planes without subjective c/o visual blurring at self regulated pace.   Baseline Target date 02/02/17   Time 4   Period Weeks     PT SHORT TERM GOAL #3   Title The patient will tolerate positional testing to identify L BPPV further (wants to have her daughter drive her to therapy due to intolerance for L sidelying)   Baseline Target date 02/02/17   Time 4   Period Weeks           PT Long Term Goals - 01/04/17 1652      PT LONG TERM GOAL #1   Title The patient will be independent in progression of HEP.   Baseline Target date 03/06/17   Time 8   Period Weeks     PT LONG TERM GOAL #2   Title The patient will improve DHI from 48% to < or equal to 32% to demo reduced self perception of dizziness.   Baseline Target date 03/06/17   Time 8   Period Weeks     PT LONG TERM GOAL #3   Title The patient will have a 2 line difference in static vs dynamic visual acuity to demo improving use of VOR.   Baseline Target date 03/06/17   Time 8   Period Weeks     PT LONG TERM GOAL #4   Title The patient will tolerate lying sit>L sidelying without reports of dizziness/vertigo.   Baseline Target date 03/06/17   Time 8   Period Weeks     PT LONG TERM GOAL #5   Title The patient will maintain standing feet together + eyes closed x 30 seconds on compliant surfaces to demonstrate improving multi-sensory balance.   Baseline Target date 03/06/17   Time 8   Period Weeks               Plan -  01/17/17 1427    Clinical Impression Statement The patient  has motion sensitivity with movement lying supine, rolling and sit<>sidelying, however no true nystagmus viewed in room light.  Patient has been avoiding these movements x 3.5 years due to h/o BPPV.  PT recommended we begin by slowly acclimating to these movements via a habituation program.  Modified HEP.  Continue working towards STGs/LTGs.    PT Treatment/Interventions ADLs/Self Care Home Management;Therapeutic activities;Therapeutic exercise;Balance training;Neuromuscular re-education;Gait training;Functional mobility training;Patient/family education;Vestibular;Canalith Repostioning   PT Next Visit Plan Add sit<>sidelying habituation, check VOR/progress time, SOT?  , dynamic gait and balance   Consulted and Agree with Plan of Care Patient      Patient will benefit from skilled therapeutic intervention in order to improve the following deficits and impairments:  Abnormal gait, Difficulty walking, Dizziness, Decreased mobility, Decreased balance, Decreased activity tolerance  Visit Diagnosis: BPPV (benign paroxysmal positional vertigo), left  Dizziness and giddiness  Other abnormalities of gait and mobility  Unsteadiness on feet     Problem List Patient Active Problem List   Diagnosis Date Noted  . Spider veins of both lower extremities 10/02/2016  . Spider vein, symptomatic 10/09/2012  . Varicose veins of bilateral lower extremities with other complications 43/56/8616    Turquoise Esch, PT 01/17/2017, 2:31 PM  [facility] [address] [place], [place], [address] Phone: [phone]   Fax:  [phone]  Name: LONNIE ROSADO MRN: [id number] Date of Birth: [birth year]

## 2017-01-17 NOTE — Patient Instructions (Signed)
Habituation - Tip Card  1.The goal of habituation training is to assist in decreasing symptoms of vertigo, dizziness, or nausea provoked by specific head and body motions. 2.These exercises may initially increase symptoms; however, be persistent and work through symptoms. With repetition and time, the exercises will assist in reducing or eliminating symptoms. 3.Exercises should be stopped and discussed with the therapist if you experience any of the following: - Sudden change or fluctuation in hearing - New onset of ringing in the ears, or increase in current intensity - Any fluid discharge from the ear - Severe pain in neck or back - Extreme nausea  Copyright  VHI. All rights reserved.   Habituation - Rolling   With pillow under head, start on back. Roll to your right side.  Hold until dizziness stops, plus 20 seconds and then roll to the left side.  Hold until dizziness stops, plus 20 seconds.  Repeat sequence 5 times per session. Do 2 sessions per day.  Copyright  VHI. All rights reserved.   Gaze Stabilization - Tip Card  1.Target must remain in focus, not blurry, and appear stationary while head is in motion. 2.Perform exercises with small head movements (45 to either side of midline). 3.Increase speed of head motion so long as target is in focus. 4.If you wear eyeglasses, be sure you can see target through lens (therapist will give specific instructions for bifocal / progressive lenses). 5.These exercises may provoke dizziness or nausea. Work through these symptoms. If too dizzy, slow head movement slightly. Rest between each exercise. 6.Exercises demand concentration; avoid distractions. 7.For safety, perform standing exercises close to a counter, wall, corner, or next to someone.  Copyright  VHI. All rights reserved.   Gaze Stabilization - Standing Feet Apart   Feet shoulder width apart, keeping eyes on target on wall 3 feet away, tilt head down slightly and move head side  to side for 30 seconds. Repeat while moving head up and down for 30 seconds. *Work up to tolerating 60 seconds, as able. Do 2-3 sessions per day.   Copyright  VHI. All rights reserved.   Feet Heel-Toe "Tandem", Varied Arm Positions - Eyes Open    With eyes open, right foot directly in front of the other, arms out, look straight ahead at a stationary object. Hold __30__ seconds and then switch feet.  Repeat _3___ times per session. Do __2__ sessions per day.  Copyright  VHI. All rights reserved.   Feet Apart (Compliant Surface) Varied Arm Positions - Eyes Closed    Stand on compliant surface: __pillow______ with feet shoulder width apart and arms out. Close eyes and visualize upright position. Hold_10-15___ seconds. Repeat __3__ times per session. Do __2__ sessions per day.  Copyright  VHI. All rights reserved.   Feet Apart (Compliant Surface) Head Motion - Eyes Open    EYES AND HEAD MOVE TOGETHER. With eyes open, standing on compliant surface: __pillow__, feet shoulder width apart, move head slowly: up and down x 5 repetitions.  Repeat side to side x 5 repetitions. Repeat __1__ times per session. Do _1-2___ sessions per day.  Copyright  VHI. All rights reserved.

## 2017-01-24 ENCOUNTER — Ambulatory Visit: Payer: Medicare Other | Admitting: Rehabilitative and Restorative Service Providers"

## 2017-01-24 DIAGNOSIS — H8112 Benign paroxysmal vertigo, left ear: Secondary | ICD-10-CM | POA: Diagnosis not present

## 2017-01-24 DIAGNOSIS — R2689 Other abnormalities of gait and mobility: Secondary | ICD-10-CM | POA: Diagnosis not present

## 2017-01-24 DIAGNOSIS — R42 Dizziness and giddiness: Secondary | ICD-10-CM | POA: Diagnosis not present

## 2017-01-24 DIAGNOSIS — R2681 Unsteadiness on feet: Secondary | ICD-10-CM

## 2017-01-24 NOTE — Therapy (Signed)
[facility] [address] [place], [place], [address] Phone: [phone]   Fax:  [phone]  Physical Therapy Treatment  Patient Details  Name: Mandy Johnson MRN: [id number] Date of Birth: 02-16-1951 Referring Provider: Donnie Coffin, MD  Encounter Date: 01/24/2017      PT End of Session - 01/24/17 1131    Visit Number 3   Number of Visits 8   Date for PT Re-Evaluation 03/05/17   Authorization Type G code every 10th visit   PT Start Time 1110   PT Stop Time 1150   PT Time Calculation (min) 40 min   Activity Tolerance Patient tolerated treatment well   Behavior During Therapy [facility] for tasks assessed/performed      Past Medical History:  Diagnosis Date  . Thyroid disease   . 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 ABDOMINAL HYSTERECTOMY      There were no vitals filed for this visit.      Subjective Assessment - 01/24/17 1108    Subjective The patient reports she has been tolerating the home exercises well at home and sometimes it feels like a spin could start, but it doesn't go into it.  She is also sleeping in a lower position and now she can lay flat on her back with a pillow.    Pertinent History h/o goiter, thyroid disease, some mild discomfort behind left ear.   H/o migraines since she was 11 (visual aura lasts 20-45 minutes, headache can last a day--has to sleep for it to go away).  Improved with age/  notes some fogginess x hours.    Patient Stated Goals Figure out some exercises to help improve dizziness.    Currently in Pain? No/denies                         [facility] Adult PT Treatment/Exercise - 01/24/17 2051      Standardized Balance Assessment   Standardized Balance Assessment Balance Master Testing     High Level Balance   High Level Balance Comments Patient scored 29% on sensory organization testing compared to age/height  norms of 70%.  She demonstrates reliance on somatosensory inputs (80% comp to norm of 85%), 0% use of visual and vestibular inputs (compared to norms of 80% visual and 55% vestibular).  Patient has more frequent posterior losses of balance.          Vestibular Treatment/Exercise - 01/24/17 1117      Vestibular Treatment/Exercise   Vestibular Treatment Provided Habituation;Gaze   Habituation Exercises Laruth Bouchard Daroff;Horizontal Roll   Gaze Exercises X1 Viewing Horizontal     Nestor Lewandowsky   Number of Reps  3   Symptom Description  Notes mild sensation that dizziness could come on.      Horizontal Roll   Number of Reps  2   Symptom Description  Discussed not stopping in the middle and increasing speed of movement as tolerated.     X1 Viewing Horizontal   Foot Position standing   Comments increasing speed and working up to 60 seconds.     X1 Viewing Vertical   Foot Position standing   Comments increasing speed and working up to 60 seconds.               PT Education - 01/24/17 1131    Education provided Yes   Education Details HEP: updated  adding brandt daroff and increasing VOR to 60 seconds   Person(s) Educated Patient   Methods Explanation;Demonstration;Handout   Comprehension Verbalized understanding;Returned demonstration          PT Short Term Goals - 01/04/17 1617      PT SHORT TERM GOAL #1   Title The patient will be indep with HEP for gaze x 1 viewing, habituation and balance.   Baseline Target date 02/02/2017   Time 4   Period Weeks     PT SHORT TERM GOAL #2   Title The patient will tolerate gaze x 1 viewing x 30 seconds horiz/vertical planes without subjective c/o visual blurring at self regulated pace.   Baseline Target date 02/02/17   Time 4   Period Weeks     PT SHORT TERM GOAL #3   Title The patient will tolerate positional testing to identify L BPPV further (wants to have her daughter drive her to therapy due to intolerance for L sidelying)    Baseline Target date 02/02/17   Time 4   Period Weeks           PT Long Term Goals - 01/04/17 1652      PT LONG TERM GOAL #1   Title The patient will be independent in progression of HEP.   Baseline Target date 03/06/17   Time 8   Period Weeks     PT LONG TERM GOAL #2   Title The patient will improve DHI from 48% to < or equal to 32% to demo reduced self perception of dizziness.   Baseline Target date 03/06/17   Time 8   Period Weeks     PT LONG TERM GOAL #3   Title The patient will have a 2 line difference in static vs dynamic visual acuity to demo improving use of VOR.   Baseline Target date 03/06/17   Time 8   Period Weeks     PT LONG TERM GOAL #4   Title The patient will tolerate lying sit>L sidelying without reports of dizziness/vertigo.   Baseline Target date 03/06/17   Time 8   Period Weeks     PT LONG TERM GOAL #5   Title The patient will maintain standing feet together + eyes closed x 30 seconds on compliant surfaces to demonstrate improving multi-sensory balance.   Baseline Target date 03/06/17   Time 8   Period Weeks               Plan - 01/24/17 2048    Clinical Impression Statement The patient is tolerating sit<>sidelying and rolling better this week.  She is also able to begin to progress VOR up to 60 seconds.  Sensory organization testing revealed dependence on somatosensory feedback for balance.  PT to continue to work on Therapist, sports training to optimize functional outcome.   PT Treatment/Interventions ADLs/Self Care Home Management;Therapeutic activities;Therapeutic exercise;Balance training;Neuromuscular re-education;Gait training;Functional mobility training;Patient/family education;Vestibular;Canalith Repostioning   PT Next Visit Plan Balance working on inc'ing vestibular/visual inputs for balance.   Consulted and Agree with Plan of Care Patient      Patient will benefit from skilled therapeutic intervention in order to improve the  following deficits and impairments:  Abnormal gait, Difficulty walking, Dizziness, Decreased mobility, Decreased balance, Decreased activity tolerance  Visit Diagnosis: BPPV (benign paroxysmal positional vertigo), left  Dizziness and giddiness  Other abnormalities of gait and mobility  Unsteadiness on feet     Problem List Patient Active Problem List   Diagnosis Date Noted  .  Spider veins of both lower extremities 10/02/2016  . Spider vein, symptomatic 10/09/2012  . Varicose veins of bilateral lower extremities with other complications 77/11/4033    Odetta Forness, PT 01/24/2017, 8:54 PM  [facility] [address] [place], [place], [address] Phone: [phone]   Fax:  [phone]  Name: Mandy Johnson MRN: [id number] Date of Birth: 12/03/1950

## 2017-01-24 NOTE — Patient Instructions (Signed)
Habituation - Tip Card  1.The goal of habituation training is to assist in decreasing symptoms of vertigo, dizziness, or nausea provoked by specific head and body motions. 2.These exercises may initially increase symptoms; however, be persistent and work through symptoms. With repetition and time, the exercises will assist in reducing or eliminating symptoms. 3.Exercises should be stopped and discussed with the therapist if you experience any of the following: - Sudden change or fluctuation in hearing - New onset of ringing in the ears, or increase in current intensity - Any fluid discharge from the ear - Severe pain in neck or back - Extreme nausea  Copyright  VHI. All rights reserved.  Sit to Side-Lying    Sit on edge of bed. 1. Turn head 45 to right. 2. Maintain head position and lie down slowly on left side. Hold until symptoms subside, plus 30 seconds.  3. Sit up slowly. Hold until symptoms subside, plus 30 seconds. . 4. Turn head 45 to left. 5. Maintain head position and lie down slowly on right side. Hold until symptoms subside, plus 30 seconds. 6. Sit up slowly. Repeat sequence __3-5__ times per session. Do _2___ sessions per day.  Copyright  VHI. All rights reserved.   Habituation - Rolling   With pillow under head, start on back. Roll to your right side.  Hold until dizziness stops, plus 20 seconds and then roll to the left side.  Hold until dizziness stops, plus 20 seconds.  Repeat sequence 5 times per session. Do 2 sessions per day.  Copyright  VHI. All rights reserved.   Gaze Stabilization - Tip Card  1.Target must remain in focus, not blurry, and appear stationary while head is in motion. 2.Perform exercises with small head movements (45 to either side of midline). 3.Increase speed of head motion so long as target is in focus. 4.If you wear eyeglasses, be sure you can see target through lens (therapist will give specific instructions for bifocal / progressive  lenses). 5.These exercises may provoke dizziness or nausea. Work through these symptoms. If too dizzy, slow head movement slightly. Rest between each exercise. 6.Exercises demand concentration; avoid distractions. 7.For safety, perform standing exercises close to a counter, wall, corner, or next to someone.  Copyright  VHI. All rights reserved.  Gaze Stabilization - Standing Feet Apart   Feet shoulder width apart, keeping eyes on target on wall 3 feet away, tilt head down slightly and move head side to side for 30 seconds. Repeat while moving head up and down for 30 seconds. *Work up to tolerating 60 seconds, as able. Do 2-3 sessions per day.   Copyright  VHI. All rights reserved.  Feet Heel-Toe "Tandem", Varied Arm Positions - Eyes Open    With eyes open, right foot directly in front of the other, arms out, look straight ahead at a stationary object. Hold __30__ seconds and then switch feet.  Repeat _3___ times per session. Do __2__ sessions per day.  Copyright  VHI. All rights reserved.  Feet Apart (Compliant Surface) Varied Arm Positions - Eyes Closed    Stand on compliant surface: __pillow______ with feet shoulder width apart and arms out. Close eyes and visualize upright position. Hold_10-15___ seconds. Repeat __3__ times per session. Do __2__ sessions per day.  Copyright  VHI. All rights reserved.  Feet Apart (Compliant Surface) Head Motion - Eyes Open    EYES AND HEAD MOVE TOGETHER. With eyes open, standing on compliant surface: __pillow__, feet shoulder width apart, move head slowly: up and  down x 5 repetitions.  Repeat side to side x 5 repetitions. Repeat __1__ times per session. Do _1-2___ sessions per day.  Copyright  VHI. All rights reserved.

## 2017-01-31 ENCOUNTER — Ambulatory Visit: Payer: Medicare Other | Admitting: Rehabilitative and Restorative Service Providers"

## 2017-01-31 DIAGNOSIS — R2689 Other abnormalities of gait and mobility: Secondary | ICD-10-CM | POA: Diagnosis not present

## 2017-01-31 DIAGNOSIS — R2681 Unsteadiness on feet: Secondary | ICD-10-CM

## 2017-01-31 DIAGNOSIS — R42 Dizziness and giddiness: Secondary | ICD-10-CM | POA: Diagnosis not present

## 2017-01-31 DIAGNOSIS — H8112 Benign paroxysmal vertigo, left ear: Secondary | ICD-10-CM

## 2017-01-31 NOTE — Therapy (Signed)
[facility] [address] [place], [place], [address] Phone: [phone]   Fax:  [phone]  Physical Therapy Treatment  Patient Details  Name: Mandy Johnson MRN: [id number] Date of Birth: 07-20-1951 Referring Provider: Donnie Coffin, MD  Encounter Date: 01/31/2017      PT End of Session - 01/31/17 1903    Visit Number 4   Number of Visits 8   Date for PT Re-Evaluation 03/05/17   Authorization Type G code every 10th visit   PT Start Time 1108   PT Stop Time 1148   PT Time Calculation (min) 40 min   Activity Tolerance Patient tolerated treatment well   Behavior During Therapy [facility] for tasks assessed/performed      Past Medical History:  Diagnosis Date  . Thyroid disease   . 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 ABDOMINAL HYSTERECTOMY      There were no vitals filed for this visit.      Subjective Assessment - 01/31/17 1730    Subjective The patient is doing HEP regularly.  She notes still sleeping propped up due to fear of symptom provocation.   Pertinent History h/o goiter, thyroid disease, some mild discomfort behind left ear.   H/o migraines since she was 11 (visual aura lasts 20-45 minutes, headache can last a day--has to sleep for it to go away).  Improved with age/  notes some fogginess x hours.    Patient Stated Goals Figure out some exercises to help improve dizziness.                          [facility] Adult PT Treatment/Exercise - 01/31/17 1906      Self-Care   Self-Care Other Self-Care Comments   Other Self-Care Comments  Encouraged continued increase in movement discussing gradual return to sleeping flat (without stack of pillows), and returning to social /recreational activities.       Neuro Re-ed    Neuro Re-ed Details  Compliant surface standing working on head motion, marching, standing with eyes closed.  Rocker board  emphasizing posterior to anterior balance control with eyes open performing wall bumps with CGA.  Worked on steady state standing on rocker board maintaining center of gravity control in A/P direction.  VOR x 1 reviewed performing increased speed of movement and cues for visual fixation.      Exercises   Exercises Other Exercises   Other Exercises  Supine chin tucks and passive overpressure into flexion + rotation.  Worked on supine neck rotation and seated neck rotation decreasing postural guarding.                  PT Education - 01/31/17 1902    Education provided Yes   Education Details HEP added chin tucks and continue prior program   Person(s) Educated Patient   Methods Explanation;Demonstration;Handout   Comprehension Returned demonstration;Verbalized understanding          PT Short Term Goals - 01/04/17 1617      PT SHORT TERM GOAL #1   Title The patient will be indep with HEP for gaze x 1 viewing, habituation and balance.   Baseline Target date 02/02/2017   Time 4   Period Weeks     PT SHORT TERM GOAL #2   Title The patient will tolerate gaze x 1 viewing x 30 seconds horiz/vertical planes without  subjective c/o visual blurring at self regulated pace.   Baseline Target date 02/02/17   Time 4   Period Weeks     PT SHORT TERM GOAL #3   Title The patient will tolerate positional testing to identify L BPPV further (wants to have her daughter drive her to therapy due to intolerance for L sidelying)   Baseline Target date 02/02/17   Time 4   Period Weeks           PT Long Term Goals - 01/04/17 1652      PT LONG TERM GOAL #1   Title The patient will be independent in progression of HEP.   Baseline Target date 03/06/17   Time 8   Period Weeks     PT LONG TERM GOAL #2   Title The patient will improve DHI from 48% to < or equal to 32% to demo reduced self perception of dizziness.   Baseline Target date 03/06/17   Time 8   Period Weeks     PT LONG TERM GOAL  #3   Title The patient will have a 2 line difference in static vs dynamic visual acuity to demo improving use of VOR.   Baseline Target date 03/06/17   Time 8   Period Weeks     PT LONG TERM GOAL #4   Title The patient will tolerate lying sit>L sidelying without reports of dizziness/vertigo.   Baseline Target date 03/06/17   Time 8   Period Weeks     PT LONG TERM GOAL #5   Title The patient will maintain standing feet together + eyes closed x 30 seconds on compliant surfaces to demonstrate improving multi-sensory balance.   Baseline Target date 03/06/17   Time 8   Period Weeks               Plan - 01/31/17 1919    Clinical Impression Statement The patient is improving overall tolerance to activities involving head motion, general movement and sit<>supine.  PT progressing multi-sensory balance activities working to improve use of vestibular inputs for balance.     PT Treatment/Interventions ADLs/Self Care Home Management;Therapeutic activities;Therapeutic exercise;Balance training;Neuromuscular re-education;Gait training;Functional mobility training;Patient/family education;Vestibular;Canalith Repostioning   PT Next Visit Plan Balance working on inc'ing vestibular/visual inputs for balance.  Check HEP, progress to LTGs.   Consulted and Agree with Plan of Care Patient      Patient will benefit from skilled therapeutic intervention in order to improve the following deficits and impairments:  Abnormal gait, Difficulty walking, Dizziness, Decreased mobility, Decreased balance, Decreased activity tolerance  Visit Diagnosis: BPPV (benign paroxysmal positional vertigo), left  Dizziness and giddiness  Other abnormalities of gait and mobility  Unsteadiness on feet     Problem List Patient Active Problem List   Diagnosis Date Noted  . Spider veins of both lower extremities 10/02/2016  . Spider vein, symptomatic 10/09/2012  . Varicose veins of bilateral lower extremities with  other complications 11/91/4782    Mandy Johnson, PT 01/31/2017, 7:20 PM  [facility] [address] [place], [place], [address] Phone: [phone]   Fax:  [phone]  Name: Mandy Johnson MRN: [id number] Date of Birth: 1951/06/03

## 2017-01-31 NOTE — Patient Instructions (Signed)
CONTINUE PRIOR EXERCISES, can find a thicker surface to stand on (like sofa cushion) near support surface.  Stand in place and toss a ball right to left hand for coordination and head movement.   Head Press With [place] chin SLIGHTLY toward chest, keep mouth closed. Feel weight on back of head. Increase weight by pressing head down. Hold _3-5__ seconds. Relax. Repeat __5-10_ times. Surface: floor or bed with one pillow.  Copyright  VHI. All rights reserved.

## 2017-02-15 ENCOUNTER — Ambulatory Visit: Payer: Medicare Other | Attending: Family Medicine | Admitting: Rehabilitative and Restorative Service Providers"

## 2017-02-15 DIAGNOSIS — H8112 Benign paroxysmal vertigo, left ear: Secondary | ICD-10-CM | POA: Insufficient documentation

## 2017-02-15 DIAGNOSIS — R42 Dizziness and giddiness: Secondary | ICD-10-CM | POA: Diagnosis not present

## 2017-02-15 DIAGNOSIS — R2681 Unsteadiness on feet: Secondary | ICD-10-CM | POA: Insufficient documentation

## 2017-02-15 DIAGNOSIS — R2689 Other abnormalities of gait and mobility: Secondary | ICD-10-CM | POA: Diagnosis not present

## 2017-02-15 NOTE — Therapy (Signed)
[facility] [address] [place], [place], [address] Phone: [phone]   Fax:  [phone]  Physical Therapy Treatment  Patient Details  Name: Mandy Johnson MRN: [id number] Date of Birth: 1950/11/24 Referring Provider: Donnie Coffin, MD  Encounter Date: 02/15/2017      PT End of Session - 02/15/17 1430    Visit Number 5   Number of Visits 8   Date for PT Re-Evaluation 03/05/17   Authorization Type G code every 10th visit   PT Start Time 1318   PT Stop Time 1415   PT Time Calculation (min) 57 min   Activity Tolerance Patient tolerated treatment well   Behavior During Therapy [facility] for tasks assessed/performed      Past Medical History:  Diagnosis Date  . Thyroid disease   . 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 ABDOMINAL HYSTERECTOMY      There were no vitals filed for this visit.      Subjective Assessment - 02/15/17 1319    Subjective The patient notes that she is experiencing some increased symptoms with sit<>sidelying and rolling.    Pertinent History h/o goiter, thyroid disease, some mild discomfort behind left ear.   H/o migraines since she was 11 (visual aura lasts 20-45 minutes, headache can last a day--has to sleep for it to go away).  Improved with age/  notes some fogginess x hours.    Patient Stated Goals Figure out some exercises to help improve dizziness.    Currently in Pain? No/denies                Vestibular Assessment - 02/15/17 1341      Vestibular Assessment   General Observation Patient notes that she had return of some dizziness with brandt daroff and rolling habituation.     Visual Acuity   Static line 7   Dynamic line 5     Positional Testing   Sidelying Test Sidelying Right;Sidelying Left   Horizontal Canal Testing Horizontal Canal Right;Horizontal Canal Left     Sidelying Right   Sidelying Right Duration  none   Sidelying Right Symptoms No nystagmus     Sidelying Left   Sidelying Left Duration mild ocular flutter noted   Sidelying Left Symptoms No nystagmus     Horizontal Canal Right   Horizontal Canal Right Duration none   Horizontal Canal Right Symptoms Normal     Horizontal Canal Left   Horizontal Canal Left Duration none   Horizontal Canal Left Symptoms Normal                 [facility] Adult PT Treatment/Exercise - 02/15/17 1424      Standardized Balance Assessment   Standardized Balance Assessment Balance Master Testing     High Level Balance   High Level Balance Comments The patient scored 52% today (age/height norm of 70% and improved from 29% last testing date 5/16).  Patient scores 80% somatosensory use (norm of 85%), 60% visual use (norm of 80%, and 40% vestibular use (norm of 55%).  Patient still has intermittent posterior loss of balance.  Showing significant improvement.     Self-Care   Self-Care Other Self-Care Comments   Other Self-Care Comments  Patient notes worsening symptoms after increased salt intake last weekend.  She felt return of positional symptoms.  PT recommended further discussion with MD if pattern continues.  Also discussed continued increased in  exercise/tolerance with L sidelying to sleep, dec'd avoidance behaviors and working through symptoms.      Neuro Re-ed    Neuro Re-ed Details  Corner balance exercises with eyes closed with posterior sway noted using wall for intermittent support.         Vestibular Treatment/Exercise - 02/15/17 1343      Vestibular Treatment/Exercise   Vestibular Treatment Provided Habituation;Gaze   Habituation Exercises Laruth Bouchard Daroff;Horizontal Roll   Gaze Exercises X1 Viewing Horizontal     Nestor Lewandowsky   Number of Reps  3   Symptom Description  mild ocular flutter noted with first repetition of L sidelying, no symptoms after first rep     Horizontal Roll   Number of Reps  2   Symptom Description  no  dizziness noted                 PT Short Term Goals - 02/15/17 1324      PT SHORT TERM GOAL #1   Title The patient will be indep with HEP for gaze x 1 viewing, habituation and balance.   Baseline Target date 02/02/2017   Time 4   Period Weeks   Status Achieved     PT SHORT TERM GOAL #2   Title The patient will tolerate gaze x 1 viewing x 30 seconds horiz/vertical planes without subjective c/o visual blurring at self regulated pace.   Baseline Met on 02/14/17: with patient demonstrating 60 seconds.    Time 4   Period Weeks   Status Achieved     PT SHORT TERM GOAL #3   Title The patient will tolerate positional testing to identify L BPPV further (wants to have her daughter drive her to therapy due to intolerance for L sidelying)   Baseline Performed positional testing.   Time 4   Period Weeks   Status Achieved           PT Long Term Goals - 01/04/17 1652      PT LONG TERM GOAL #1   Title The patient will be independent in progression of HEP.   Baseline Target date 03/06/17   Time 8   Period Weeks     PT LONG TERM GOAL #2   Title The patient will improve DHI from 48% to < or equal to 32% to demo reduced self perception of dizziness.   Baseline Target date 03/06/17   Time 8   Period Weeks     PT LONG TERM GOAL #3   Title The patient will have a 2 line difference in static vs dynamic visual acuity to demo improving use of VOR.   Baseline Target date 03/06/17   Time 8   Period Weeks     PT LONG TERM GOAL #4   Title The patient will tolerate lying sit>L sidelying without reports of dizziness/vertigo.   Baseline Target date 03/06/17   Time 8   Period Weeks     PT LONG TERM GOAL #5   Title The patient will maintain standing feet together + eyes closed x 30 seconds on compliant surfaces to demonstrate improving multi-sensory balance.   Baseline Target date 03/06/17   Time 8   Period Weeks               Plan - 02/15/17 1428    Clinical Impression  Statement The patient improved by 23% for sensory organization testing and is demonstrating dec'd guarding/avoidance behaviors.  She has returned to gardening, social activities (limited) and is  increasing her tolerance to movement. The patient met all STGs.    PT Treatment/Interventions ADLs/Self Care Home Management;Therapeutic activities;Therapeutic exercise;Balance training;Neuromuscular re-education;Gait training;Functional mobility training;Patient/family education;Vestibular;Canalith Repostioning   PT Next Visit Plan Balance working on inc'ing vestibular/visual inputs for balance.  Check HEP, progress to LTGs.   Consulted and Agree with Plan of Care Patient      Patient will benefit from skilled therapeutic intervention in order to improve the following deficits and impairments:  Abnormal gait, Difficulty walking, Dizziness, Decreased mobility, Decreased balance, Decreased activity tolerance  Visit Diagnosis: BPPV (benign paroxysmal positional vertigo), left  Dizziness and giddiness  Other abnormalities of gait and mobility  Unsteadiness on feet     Problem List Patient Active Problem List   Diagnosis Date Noted  . Spider veins of both lower extremities 10/02/2016  . Spider vein, symptomatic 10/09/2012  . Varicose veins of bilateral lower extremities with other complications 59/06/2889    Mandy Johnson , PT 02/15/2017, 2:30 PM  [facility] [address] [place], [place], [address] Phone: [phone]   Fax:  [phone]  Name: Mandy Johnson MRN: [id number] Date of Birth: [birth year]

## 2017-02-15 NOTE — Patient Instructions (Signed)
CONTINUE PRIOR EXERCISES.  Add wearing glasses for letter exercise.  Stand 10 feet from letter, turn your head side to side x 30 seconds.   You can begin slowly until you can keep the letter focused.

## 2017-02-20 ENCOUNTER — Encounter: Payer: Medicare Other | Admitting: Rehabilitative and Restorative Service Providers"

## 2017-03-02 ENCOUNTER — Ambulatory Visit: Payer: Medicare Other | Admitting: Rehabilitative and Restorative Service Providers"

## 2017-03-02 DIAGNOSIS — R42 Dizziness and giddiness: Secondary | ICD-10-CM

## 2017-03-02 DIAGNOSIS — R2681 Unsteadiness on feet: Secondary | ICD-10-CM | POA: Diagnosis not present

## 2017-03-02 DIAGNOSIS — H8112 Benign paroxysmal vertigo, left ear: Secondary | ICD-10-CM | POA: Diagnosis not present

## 2017-03-02 DIAGNOSIS — R2689 Other abnormalities of gait and mobility: Secondary | ICD-10-CM | POA: Diagnosis not present

## 2017-03-02 NOTE — Therapy (Signed)
[facility] [address] [place], [place], [address] Phone: [phone]   Fax:  [phone]  Physical Therapy Treatment and Discharge Summary  Patient Details  Name: Mandy Johnson MRN: [id number] Date of Birth: 1951-07-31 Referring Provider: Donnie Coffin, MD  Encounter Date: 03/02/2017      PT End of Session - 03/02/17 1055    Visit Number 6   Number of Visits 8   Date for PT Re-Evaluation 03/05/17   Authorization Type G code every 10th visit   PT Start Time 1025   PT Stop Time 1050   PT Time Calculation (min) 25 min   Activity Tolerance Patient tolerated treatment well   Behavior During Therapy [facility] for tasks assessed/performed      Past Medical History:  Diagnosis Date  . Thyroid disease   . 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 ABDOMINAL HYSTERECTOMY      There were no vitals filed for this visit.      Subjective Assessment - 03/02/17 1027    Subjective The patient reports that she is doing HEP regularly.  She still notices intermittent symptoms when lying flat and is avoiding this position.     Pertinent History h/o goiter, thyroid disease, some mild discomfort behind left ear.   H/o migraines since she was 11 (visual aura lasts 20-45 minutes, headache can last a day--has to sleep for it to go away).  Improved with age/  notes some fogginess x hours.    Patient Stated Goals Figure out some exercises to help improve dizziness.    Currently in Pain? No/denies                         [facility] Adult PT Treatment/Exercise - 03/02/17 1057      Self-Care   Self-Care Other Self-Care Comments   Other Self-Care Comments  Discussed continued self mgmt of symptoms.      Neuro Re-ed    Neuro Re-ed Details  Reviewed gaze x 1 viewing, static vs dynamic visual acuity with 2 line difference, balance on foam with eyes closed and eye open + head  motion.  PT and patient discussed continued progression of exercises.  Positional sit<>sidelying reassessed without provocation of symptoms today.                     PT Short Term Goals - 02/15/17 1324      PT SHORT TERM GOAL #1   Title The patient will be indep with HEP for gaze x 1 viewing, habituation and balance.   Baseline Target date 02/02/2017   Time 4   Period Weeks   Status Achieved     PT SHORT TERM GOAL #2   Title The patient will tolerate gaze x 1 viewing x 30 seconds horiz/vertical planes without subjective c/o visual blurring at self regulated pace.   Baseline Met on 02/14/17: with patient demonstrating 60 seconds.    Time 4   Period Weeks   Status Achieved     PT SHORT TERM GOAL #3   Title The patient will tolerate positional testing to identify L BPPV further (wants to have her daughter drive her to therapy due to intolerance for L sidelying)   Baseline Performed positional testing.   Time 4   Period Weeks   Status Achieved  PT Long Term Goals - 03/02/17 1040      PT LONG TERM GOAL #1   Title The patient will be independent in progression of HEP.   Baseline Met on 03/01/2017   Time 8   Period Weeks   Status Achieved     PT LONG TERM GOAL #2   Title The patient will improve DHI from 48% to < or equal to 32% to demo reduced self perception of dizziness.   Baseline Improved from 48% to 20% on dizziness handicap index.   Time 8   Period Weeks   Status Achieved     PT LONG TERM GOAL #3   Title The patient will have a 2 line difference in static vs dynamic visual acuity to demo improving use of VOR.   Baseline The patient improved to 2 line difference on SVA vs DVA.   Time 8   Period Weeks   Status Achieved     PT LONG TERM GOAL #4   Title The patient will tolerate lying sit>L sidelying without reports of dizziness/vertigo.   Baseline Met on 03/01/2017   Time 8   Period Weeks   Status Achieved     PT LONG TERM GOAL #5   Title The  patient will maintain standing feet together + eyes closed x 30 seconds on compliant surfaces to demonstrate improving multi-sensory balance.   Baseline Met on 03/01/2017   Time 8   Period Weeks   Status Achieved               Plan - 03/02/17 1057    Clinical Impression Statement The patient met LTGs improving significantly on sensory organization and self perception of dizziness since beginning physical therapy.    PT Treatment/Interventions ADLs/Self Care Home Management;Therapeutic activities;Therapeutic exercise;Balance training;Neuromuscular re-education;Gait training;Functional mobility training;Patient/family education;Vestibular;Canalith Repostioning   PT Next Visit Plan Discharge visit.   Consulted and Agree with Plan of Care Patient      Patient will benefit from skilled therapeutic intervention in order to improve the following deficits and impairments:  Abnormal gait, Difficulty walking, Dizziness, Decreased mobility, Decreased balance, Decreased activity tolerance  Visit Diagnosis: No diagnosis found.  PHYSICAL THERAPY DISCHARGE SUMMARY  Visits from Start of Care: 6  Current functional level related to goals / functional outcomes: See above.   Remaining deficits: Positional sensitivity working on tolerance of rolling and lying flat with home program.   Education / Equipment: Continue current HEP, general mobility/activities, return to recreational activities.  Plan: Patient agrees to discharge.  Patient goals were met. Patient is being discharged due to meeting the stated rehab goals.  ?????        Thank you for the referral of this patient. Rudell Cobb, MPT   [place], PT 03/02/2017, 12:16 PM  [facility] [address] [place], [place], [address] Phone: [phone]   Fax:  [phone]  Name: Mandy Johnson MRN: [id number] Date of Birth: Aug 10, 1951

## 2017-03-12 DIAGNOSIS — H524 Presbyopia: Secondary | ICD-10-CM | POA: Diagnosis not present

## 2017-03-12 DIAGNOSIS — H2513 Age-related nuclear cataract, bilateral: Secondary | ICD-10-CM | POA: Diagnosis not present

## 2017-03-12 DIAGNOSIS — H40011 Open angle with borderline findings, low risk, right eye: Secondary | ICD-10-CM | POA: Diagnosis not present

## 2017-04-03 ENCOUNTER — Encounter (HOSPITAL_COMMUNITY): Payer: Medicare Other

## 2017-04-03 ENCOUNTER — Ambulatory Visit: Payer: Medicare Other | Admitting: Vascular Surgery

## 2017-04-09 DIAGNOSIS — H40013 Open angle with borderline findings, low risk, bilateral: Secondary | ICD-10-CM | POA: Diagnosis not present

## 2017-04-09 DIAGNOSIS — H2513 Age-related nuclear cataract, bilateral: Secondary | ICD-10-CM | POA: Diagnosis not present

## 2017-05-02 DIAGNOSIS — H25812 Combined forms of age-related cataract, left eye: Secondary | ICD-10-CM | POA: Diagnosis not present

## 2017-05-02 DIAGNOSIS — H2512 Age-related nuclear cataract, left eye: Secondary | ICD-10-CM | POA: Diagnosis not present

## 2017-05-02 DIAGNOSIS — H2511 Age-related nuclear cataract, right eye: Secondary | ICD-10-CM | POA: Diagnosis not present

## 2017-06-04 DIAGNOSIS — Z23 Encounter for immunization: Secondary | ICD-10-CM | POA: Diagnosis not present

## 2017-09-25 ENCOUNTER — Other Ambulatory Visit: Payer: Self-pay | Admitting: Family Medicine

## 2017-09-25 DIAGNOSIS — Z1231 Encounter for screening mammogram for malignant neoplasm of breast: Secondary | ICD-10-CM

## 2017-10-29 DIAGNOSIS — K635 Polyp of colon: Secondary | ICD-10-CM | POA: Diagnosis not present

## 2017-10-29 DIAGNOSIS — D126 Benign neoplasm of colon, unspecified: Secondary | ICD-10-CM | POA: Diagnosis not present

## 2017-10-29 DIAGNOSIS — K644 Residual hemorrhoidal skin tags: Secondary | ICD-10-CM | POA: Diagnosis not present

## 2017-10-29 DIAGNOSIS — Z86010 Personal history of colonic polyps: Secondary | ICD-10-CM | POA: Diagnosis not present

## 2017-10-31 DIAGNOSIS — D126 Benign neoplasm of colon, unspecified: Secondary | ICD-10-CM | POA: Diagnosis not present

## 2017-10-31 DIAGNOSIS — K635 Polyp of colon: Secondary | ICD-10-CM | POA: Diagnosis not present

## 2017-11-14 ENCOUNTER — Ambulatory Visit (INDEPENDENT_AMBULATORY_CARE_PROVIDER_SITE_OTHER): Payer: Self-pay | Admitting: *Deleted

## 2017-11-14 DIAGNOSIS — I83893 Varicose veins of bilateral lower extremities with other complications: Secondary | ICD-10-CM

## 2017-11-14 NOTE — Progress Notes (Signed)
X=.3% Sotradecol administered with a 27g butterfly.  Patient received a total of 6cc.  Treated all areas of concern with one syringe. She did not want to treat the large reticular/vv that is present over her left knee. Easy access tol well. Hoping for good results since there is reflux present.  Photos: Yes.    Compression stockings applied: Yes.

## 2017-12-07 ENCOUNTER — Ambulatory Visit
Admission: RE | Admit: 2017-12-07 | Discharge: 2017-12-07 | Disposition: A | Discharge status: Home / Self Care | Payer: Medicare Other | Source: Ambulatory Visit | Attending: Family Medicine | Admitting: Family Medicine

## 2017-12-07 DIAGNOSIS — Z1231 Encounter for screening mammogram for malignant neoplasm of breast: Secondary | ICD-10-CM | POA: Diagnosis not present

## 2017-12-11 DIAGNOSIS — H524 Presbyopia: Secondary | ICD-10-CM | POA: Diagnosis not present

## 2017-12-11 DIAGNOSIS — H2513 Age-related nuclear cataract, bilateral: Secondary | ICD-10-CM | POA: Diagnosis not present

## 2017-12-26 DIAGNOSIS — Z0000 Encounter for general adult medical examination without abnormal findings: Secondary | ICD-10-CM | POA: Diagnosis not present

## 2017-12-26 DIAGNOSIS — Z1159 Encounter for screening for other viral diseases: Secondary | ICD-10-CM | POA: Diagnosis not present

## 2017-12-26 DIAGNOSIS — E7800 Pure hypercholesterolemia, unspecified: Secondary | ICD-10-CM | POA: Diagnosis not present

## 2017-12-26 DIAGNOSIS — E039 Hypothyroidism, unspecified: Secondary | ICD-10-CM | POA: Diagnosis not present

## 2018-02-20 DIAGNOSIS — H2511 Age-related nuclear cataract, right eye: Secondary | ICD-10-CM | POA: Diagnosis not present

## 2018-02-20 DIAGNOSIS — H25811 Combined forms of age-related cataract, right eye: Secondary | ICD-10-CM | POA: Diagnosis not present

## 2018-05-29 DIAGNOSIS — Z23 Encounter for immunization: Secondary | ICD-10-CM | POA: Diagnosis not present

## 2018-09-13 ENCOUNTER — Other Ambulatory Visit: Payer: Self-pay | Admitting: Family Medicine

## 2018-09-13 DIAGNOSIS — Z1231 Encounter for screening mammogram for malignant neoplasm of breast: Secondary | ICD-10-CM

## 2018-09-24 DIAGNOSIS — H40013 Open angle with borderline findings, low risk, bilateral: Secondary | ICD-10-CM | POA: Diagnosis not present

## 2018-12-10 ENCOUNTER — Ambulatory Visit: Payer: Medicare Other

## 2018-12-30 DIAGNOSIS — E7800 Pure hypercholesterolemia, unspecified: Secondary | ICD-10-CM | POA: Diagnosis not present

## 2018-12-30 DIAGNOSIS — Z0000 Encounter for general adult medical examination without abnormal findings: Secondary | ICD-10-CM | POA: Diagnosis not present

## 2018-12-30 DIAGNOSIS — E039 Hypothyroidism, unspecified: Secondary | ICD-10-CM | POA: Diagnosis not present

## 2019-01-28 ENCOUNTER — Ambulatory Visit: Payer: Medicare Other

## 2019-03-11 ENCOUNTER — Ambulatory Visit: Payer: Medicare Other

## 2019-04-18 ENCOUNTER — Ambulatory Visit: Payer: Medicare Other

## 2019-05-05 DIAGNOSIS — E039 Hypothyroidism, unspecified: Secondary | ICD-10-CM | POA: Diagnosis not present

## 2019-05-05 DIAGNOSIS — E7800 Pure hypercholesterolemia, unspecified: Secondary | ICD-10-CM | POA: Diagnosis not present

## 2019-05-27 ENCOUNTER — Ambulatory Visit: Payer: Medicare Other

## 2019-06-14 DIAGNOSIS — Z23 Encounter for immunization: Secondary | ICD-10-CM | POA: Diagnosis not present

## 2019-07-08 ENCOUNTER — Ambulatory Visit: Payer: Medicare Other

## 2019-08-25 ENCOUNTER — Ambulatory Visit: Payer: Medicare Other

## 2019-10-10 ENCOUNTER — Ambulatory Visit: Payer: Medicare Other

## 2019-12-10 DIAGNOSIS — Z23 Encounter for immunization: Secondary | ICD-10-CM | POA: Diagnosis not present

## 2020-01-06 DIAGNOSIS — M545 Low back pain: Secondary | ICD-10-CM | POA: Diagnosis not present

## 2020-01-06 DIAGNOSIS — Z0000 Encounter for general adult medical examination without abnormal findings: Secondary | ICD-10-CM | POA: Diagnosis not present

## 2020-01-06 DIAGNOSIS — R8290 Unspecified abnormal findings in urine: Secondary | ICD-10-CM | POA: Diagnosis not present

## 2020-01-06 DIAGNOSIS — E7800 Pure hypercholesterolemia, unspecified: Secondary | ICD-10-CM | POA: Diagnosis not present

## 2020-01-06 DIAGNOSIS — E039 Hypothyroidism, unspecified: Secondary | ICD-10-CM | POA: Diagnosis not present

## 2020-01-07 DIAGNOSIS — Z23 Encounter for immunization: Secondary | ICD-10-CM | POA: Diagnosis not present

## 2020-01-14 ENCOUNTER — Other Ambulatory Visit: Payer: Self-pay | Admitting: Family Medicine

## 2020-01-14 DIAGNOSIS — Z13820 Encounter for screening for osteoporosis: Secondary | ICD-10-CM

## 2020-01-14 DIAGNOSIS — Z1231 Encounter for screening mammogram for malignant neoplasm of breast: Secondary | ICD-10-CM

## 2020-01-23 DIAGNOSIS — R8290 Unspecified abnormal findings in urine: Secondary | ICD-10-CM | POA: Diagnosis not present

## 2020-02-10 ENCOUNTER — Ambulatory Visit
Admission: RE | Admit: 2020-02-10 | Discharge: 2020-02-10 | Disposition: A | Discharge status: Home / Self Care | Payer: Medicare Other | Source: Ambulatory Visit | Attending: Family Medicine | Admitting: Family Medicine

## 2020-02-10 ENCOUNTER — Other Ambulatory Visit: Payer: Self-pay

## 2020-02-10 DIAGNOSIS — M85852 Other specified disorders of bone density and structure, left thigh: Secondary | ICD-10-CM | POA: Diagnosis not present

## 2020-02-10 DIAGNOSIS — Z13820 Encounter for screening for osteoporosis: Secondary | ICD-10-CM

## 2020-02-10 DIAGNOSIS — Z780 Asymptomatic menopausal state: Secondary | ICD-10-CM | POA: Diagnosis not present

## 2020-02-18 ENCOUNTER — Ambulatory Visit
Admission: RE | Admit: 2020-02-18 | Discharge: 2020-02-18 | Disposition: A | Discharge status: Home / Self Care | Payer: Medicare Other | Source: Ambulatory Visit | Attending: Family Medicine | Admitting: Family Medicine

## 2020-02-18 ENCOUNTER — Other Ambulatory Visit: Payer: Self-pay

## 2020-02-18 DIAGNOSIS — Z1231 Encounter for screening mammogram for malignant neoplasm of breast: Secondary | ICD-10-CM | POA: Diagnosis not present

## 2020-02-25 DIAGNOSIS — I8312 Varicose veins of left lower extremity with inflammation: Secondary | ICD-10-CM | POA: Diagnosis not present

## 2020-02-25 DIAGNOSIS — I8311 Varicose veins of right lower extremity with inflammation: Secondary | ICD-10-CM | POA: Diagnosis not present

## 2020-03-19 ENCOUNTER — Other Ambulatory Visit: Payer: Medicare Other

## 2020-03-19 DIAGNOSIS — I8312 Varicose veins of left lower extremity with inflammation: Secondary | ICD-10-CM | POA: Diagnosis not present

## 2020-03-19 DIAGNOSIS — I8311 Varicose veins of right lower extremity with inflammation: Secondary | ICD-10-CM | POA: Diagnosis not present

## 2020-03-24 DIAGNOSIS — I8312 Varicose veins of left lower extremity with inflammation: Secondary | ICD-10-CM | POA: Diagnosis not present

## 2020-03-24 DIAGNOSIS — I8311 Varicose veins of right lower extremity with inflammation: Secondary | ICD-10-CM | POA: Diagnosis not present

## 2020-05-04 DIAGNOSIS — I8312 Varicose veins of left lower extremity with inflammation: Secondary | ICD-10-CM | POA: Diagnosis not present

## 2020-05-06 DIAGNOSIS — I8312 Varicose veins of left lower extremity with inflammation: Secondary | ICD-10-CM | POA: Diagnosis not present

## 2020-05-14 DIAGNOSIS — I8312 Varicose veins of left lower extremity with inflammation: Secondary | ICD-10-CM | POA: Diagnosis not present

## 2020-06-01 DIAGNOSIS — I8312 Varicose veins of left lower extremity with inflammation: Secondary | ICD-10-CM | POA: Diagnosis not present

## 2020-06-05 DIAGNOSIS — Z23 Encounter for immunization: Secondary | ICD-10-CM | POA: Diagnosis not present

## 2020-06-15 DIAGNOSIS — I8312 Varicose veins of left lower extremity with inflammation: Secondary | ICD-10-CM | POA: Diagnosis not present

## 2020-06-15 DIAGNOSIS — M7981 Nontraumatic hematoma of soft tissue: Secondary | ICD-10-CM | POA: Diagnosis not present

## 2020-06-29 DIAGNOSIS — I8311 Varicose veins of right lower extremity with inflammation: Secondary | ICD-10-CM | POA: Diagnosis not present

## 2020-07-02 DIAGNOSIS — I8311 Varicose veins of right lower extremity with inflammation: Secondary | ICD-10-CM | POA: Diagnosis not present

## 2020-07-07 DIAGNOSIS — I8311 Varicose veins of right lower extremity with inflammation: Secondary | ICD-10-CM | POA: Diagnosis not present

## 2020-07-13 DIAGNOSIS — Z23 Encounter for immunization: Secondary | ICD-10-CM | POA: Diagnosis not present

## 2020-07-19 DIAGNOSIS — E7800 Pure hypercholesterolemia, unspecified: Secondary | ICD-10-CM | POA: Diagnosis not present

## 2020-07-21 DIAGNOSIS — I8311 Varicose veins of right lower extremity with inflammation: Secondary | ICD-10-CM | POA: Diagnosis not present

## 2020-08-04 DIAGNOSIS — I8311 Varicose veins of right lower extremity with inflammation: Secondary | ICD-10-CM | POA: Diagnosis not present

## 2020-09-20 ENCOUNTER — Other Ambulatory Visit: Payer: Self-pay

## 2020-09-20 DIAGNOSIS — Z1231 Encounter for screening mammogram for malignant neoplasm of breast: Secondary | ICD-10-CM

## 2021-01-06 DIAGNOSIS — R42 Dizziness and giddiness: Secondary | ICD-10-CM | POA: Diagnosis not present

## 2021-01-06 DIAGNOSIS — E7800 Pure hypercholesterolemia, unspecified: Secondary | ICD-10-CM | POA: Diagnosis not present

## 2021-01-06 DIAGNOSIS — E039 Hypothyroidism, unspecified: Secondary | ICD-10-CM | POA: Diagnosis not present

## 2021-01-06 DIAGNOSIS — Z0000 Encounter for general adult medical examination without abnormal findings: Secondary | ICD-10-CM | POA: Diagnosis not present

## 2021-01-24 DIAGNOSIS — E039 Hypothyroidism, unspecified: Secondary | ICD-10-CM | POA: Diagnosis not present

## 2021-02-21 ENCOUNTER — Other Ambulatory Visit: Payer: Self-pay

## 2021-02-21 ENCOUNTER — Ambulatory Visit
Admission: RE | Admit: 2021-02-21 | Discharge: 2021-02-21 | Disposition: A | Discharge status: Home / Self Care | Payer: Medicare Other | Source: Ambulatory Visit | Attending: Family Medicine | Admitting: Family Medicine

## 2021-02-21 DIAGNOSIS — Z1231 Encounter for screening mammogram for malignant neoplasm of breast: Secondary | ICD-10-CM | POA: Diagnosis not present

## 2021-02-24 ENCOUNTER — Other Ambulatory Visit: Payer: Self-pay | Admitting: Family Medicine

## 2021-02-24 DIAGNOSIS — R928 Other abnormal and inconclusive findings on diagnostic imaging of breast: Secondary | ICD-10-CM

## 2021-03-18 ENCOUNTER — Other Ambulatory Visit: Payer: Self-pay

## 2021-03-18 ENCOUNTER — Ambulatory Visit: Payer: Medicare Other

## 2021-03-18 ENCOUNTER — Ambulatory Visit
Admission: RE | Admit: 2021-03-18 | Discharge: 2021-03-18 | Disposition: A | Discharge status: Home / Self Care | Payer: Medicare Other | Source: Ambulatory Visit | Attending: Family Medicine | Admitting: Family Medicine

## 2021-03-18 DIAGNOSIS — R922 Inconclusive mammogram: Secondary | ICD-10-CM | POA: Diagnosis not present

## 2021-03-18 DIAGNOSIS — R928 Other abnormal and inconclusive findings on diagnostic imaging of breast: Secondary | ICD-10-CM

## 2021-04-21 DIAGNOSIS — Z23 Encounter for immunization: Secondary | ICD-10-CM | POA: Diagnosis not present

## 2021-06-25 DIAGNOSIS — Z23 Encounter for immunization: Secondary | ICD-10-CM | POA: Diagnosis not present

## 2021-11-15 IMAGING — MG MM DIGITAL DIAGNOSTIC UNILAT*L* W/ TOMO W/ CAD
4 series · 4 of 12 positions shown · non-contrast
Comparison: Previous exam(s).

CLINICAL DATA: 70-year-old female for further evaluation of
possible LEFT breast asymmetry on screening mammogram.

EXAM:
DIGITAL DIAGNOSTIC UNILATERAL LEFT MAMMOGRAM WITH TOMOSYNTHESIS AND
CAD
TECHNIQUE: Left digital diagnostic mammography and breast tomosynthesis was
performed. The images were evaluated with computer-aided detection.

[L MLO synth-2D]
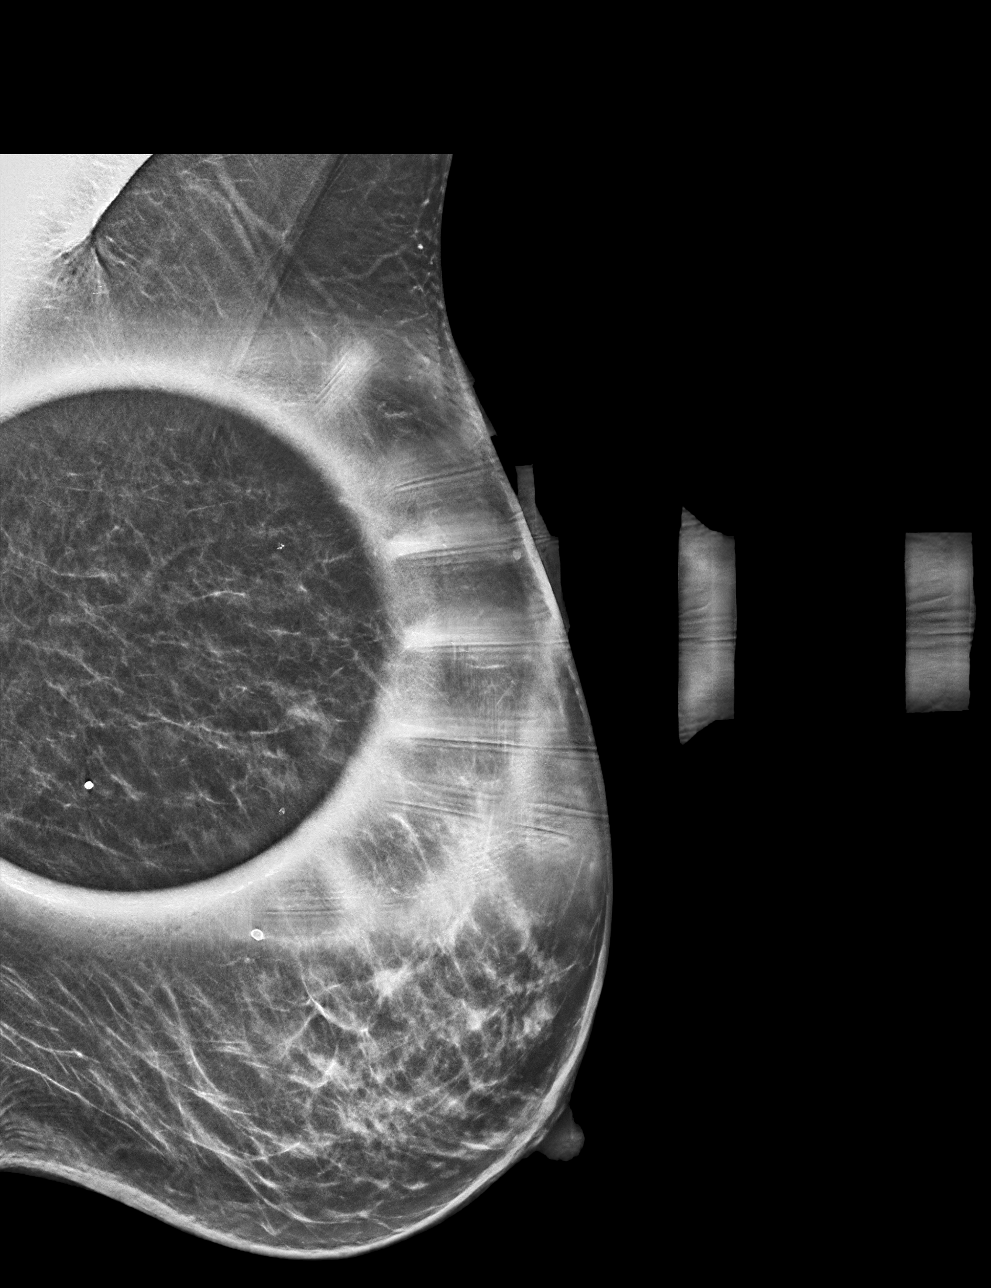

[L ML synth-2D]
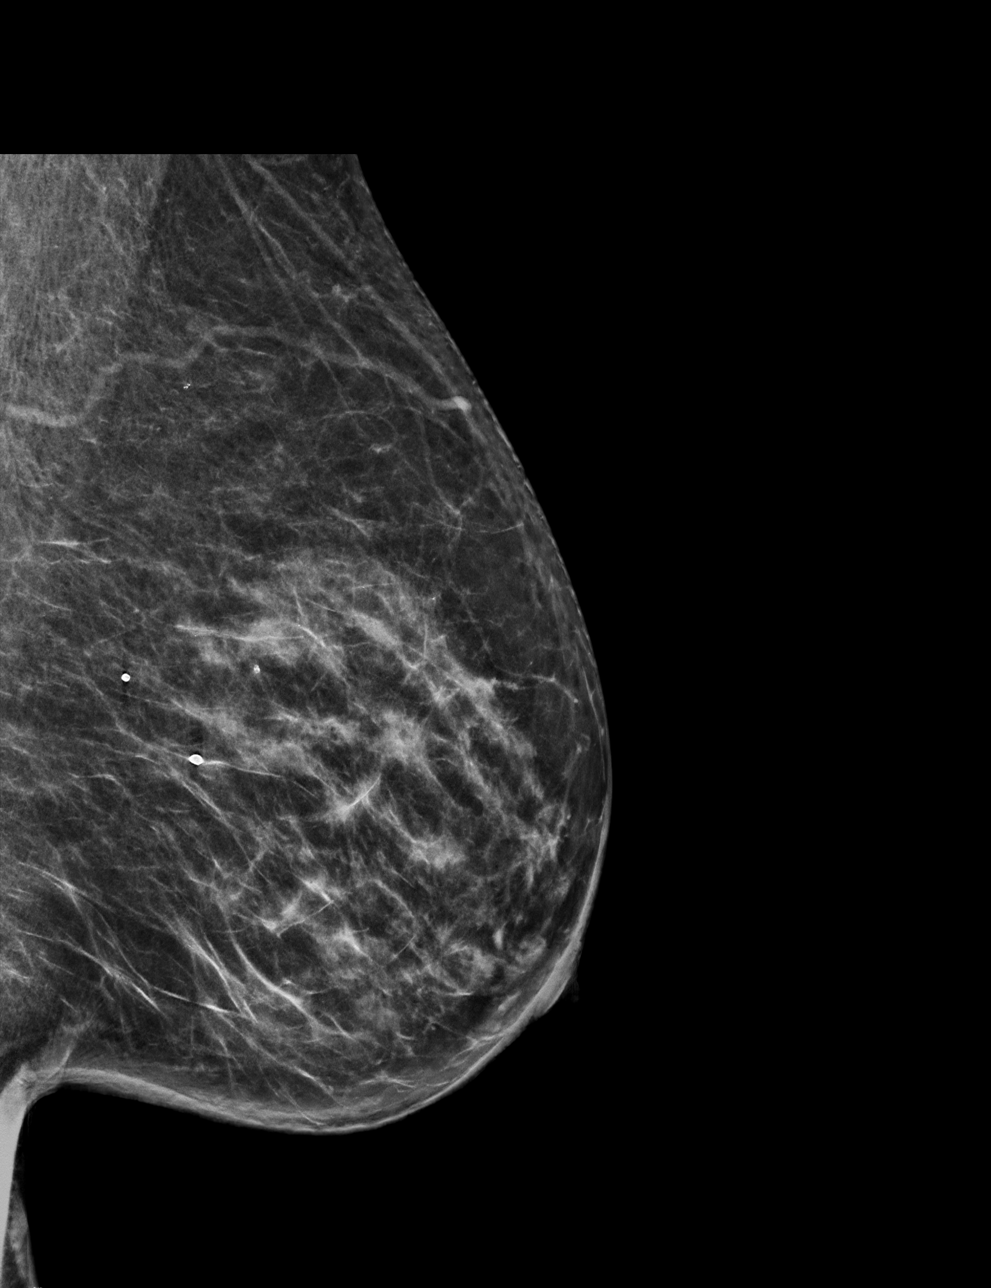

[L MLO tomo · tomo slice 28/55.0]
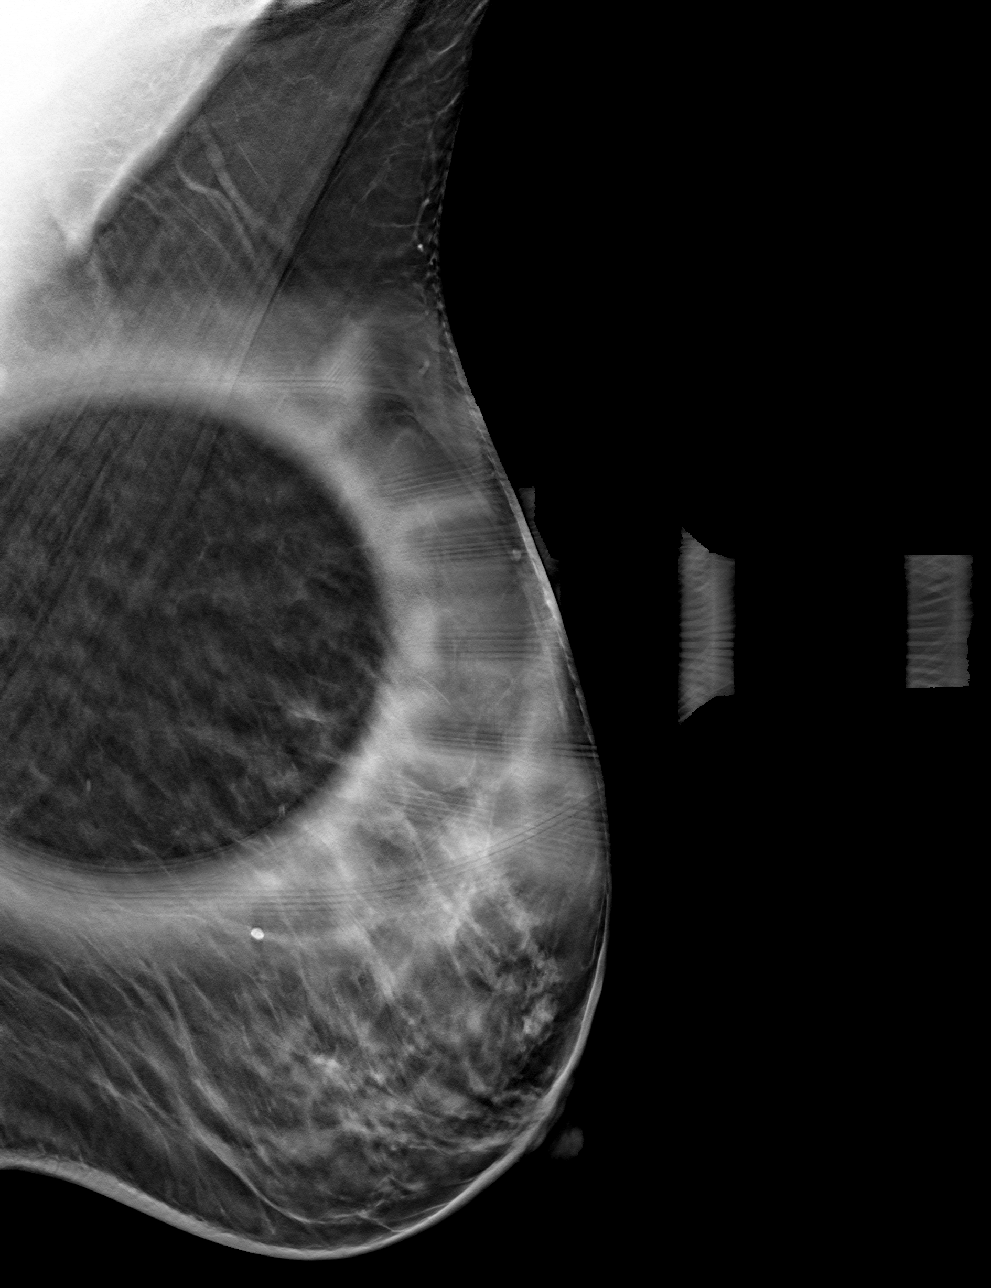

[L ML tomo · tomo slice 32/63.0]
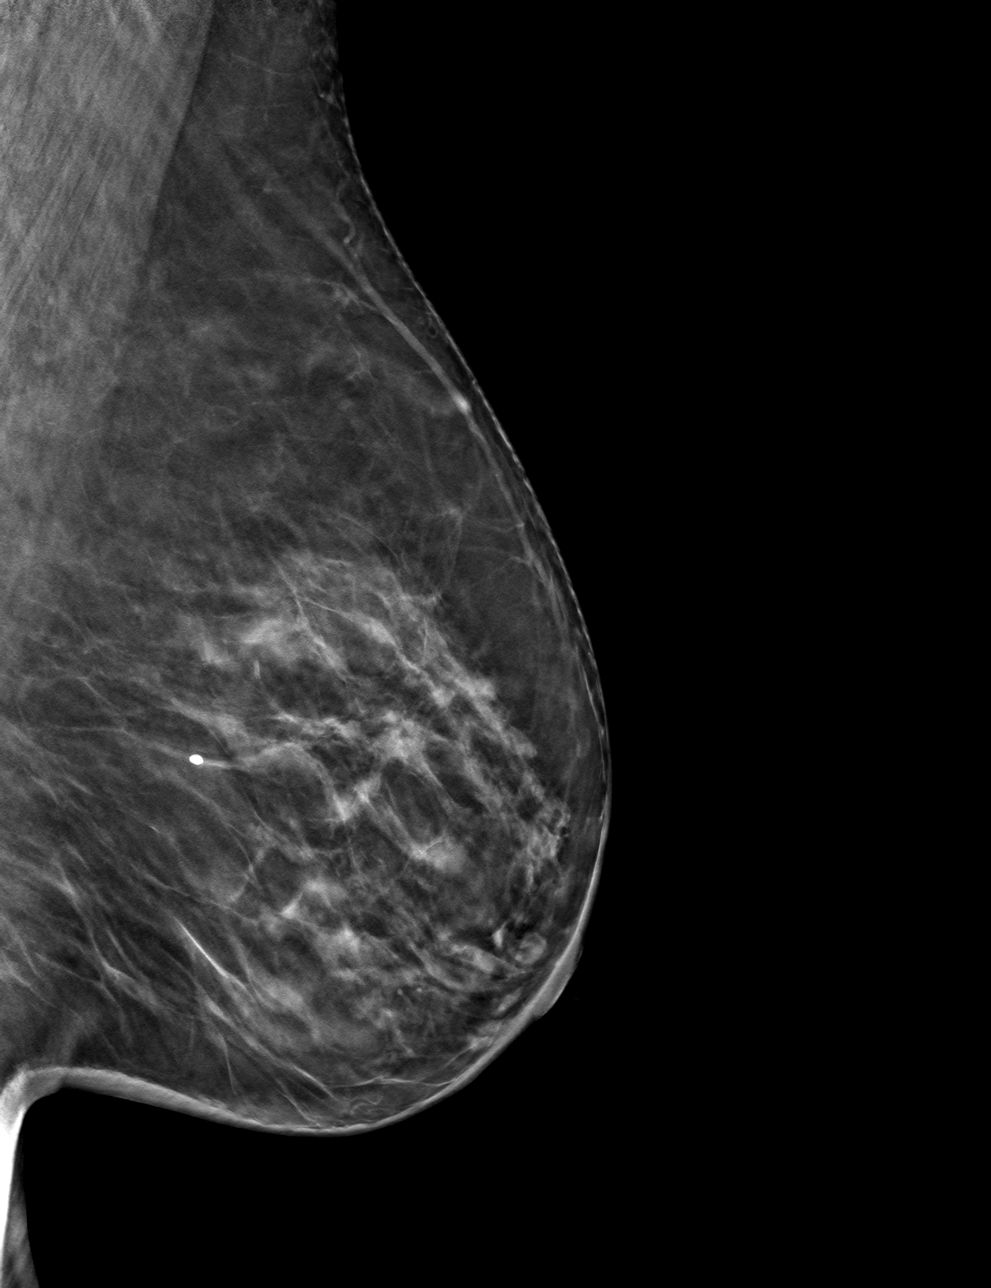

[4 of 12 positions shown; findings below may reference images not displayed]

ACR Breast Density Category c: The breast tissue is heterogeneously
dense, which may obscure small masses.
FINDINGS: 2D/3D full field and spot compression views of the LEFT breast
demonstrate dispersal of the posterior UPPER LEFT breast screening
study asymmetry. No persistent abnormalities identified at the site
of the screening study abnormality.
IMPRESSION: 1. No persistent abnormality at the area of the screening study
finding

RECOMMENDATION:
Bilateral screening mammogram in 1 year.

I have discussed the findings and recommendations with the patient.
If applicable, a reminder letter will be sent to the patient
regarding the next appointment.

BI-RADS CATEGORY  1: Negative.

## 2021-11-28 DIAGNOSIS — D224 Melanocytic nevi of scalp and neck: Secondary | ICD-10-CM | POA: Diagnosis not present

## 2021-11-28 DIAGNOSIS — L821 Other seborrheic keratosis: Secondary | ICD-10-CM | POA: Diagnosis not present

## 2021-11-28 DIAGNOSIS — M6740 Ganglion, unspecified site: Secondary | ICD-10-CM | POA: Diagnosis not present

## 2021-11-28 DIAGNOSIS — L820 Inflamed seborrheic keratosis: Secondary | ICD-10-CM | POA: Diagnosis not present

## 2022-01-09 DIAGNOSIS — M67442 Ganglion, left hand: Secondary | ICD-10-CM | POA: Diagnosis not present

## 2022-01-09 DIAGNOSIS — R42 Dizziness and giddiness: Secondary | ICD-10-CM | POA: Diagnosis not present

## 2022-01-09 DIAGNOSIS — E039 Hypothyroidism, unspecified: Secondary | ICD-10-CM | POA: Diagnosis not present

## 2022-01-09 DIAGNOSIS — Z0000 Encounter for general adult medical examination without abnormal findings: Secondary | ICD-10-CM | POA: Diagnosis not present

## 2022-01-09 DIAGNOSIS — L259 Unspecified contact dermatitis, unspecified cause: Secondary | ICD-10-CM | POA: Diagnosis not present

## 2022-01-09 DIAGNOSIS — E7800 Pure hypercholesterolemia, unspecified: Secondary | ICD-10-CM | POA: Diagnosis not present

## 2022-02-08 DIAGNOSIS — M79645 Pain in left finger(s): Secondary | ICD-10-CM | POA: Diagnosis not present

## 2022-02-08 DIAGNOSIS — R2232 Localized swelling, mass and lump, left upper limb: Secondary | ICD-10-CM | POA: Diagnosis not present

## 2022-02-20 ENCOUNTER — Other Ambulatory Visit: Payer: Self-pay | Admitting: Family Medicine

## 2022-02-20 DIAGNOSIS — Z1231 Encounter for screening mammogram for malignant neoplasm of breast: Secondary | ICD-10-CM

## 2022-02-27 DIAGNOSIS — R2232 Localized swelling, mass and lump, left upper limb: Secondary | ICD-10-CM | POA: Diagnosis not present

## 2022-04-17 ENCOUNTER — Ambulatory Visit
Admission: RE | Admit: 2022-04-17 | Discharge: 2022-04-17 | Disposition: A | Discharge status: Home / Self Care | Payer: Medicare Other | Source: Ambulatory Visit | Attending: Family Medicine | Admitting: Family Medicine

## 2022-04-17 DIAGNOSIS — Z1231 Encounter for screening mammogram for malignant neoplasm of breast: Secondary | ICD-10-CM | POA: Diagnosis not present

## 2022-07-07 DIAGNOSIS — H40023 Open angle with borderline findings, high risk, bilateral: Secondary | ICD-10-CM | POA: Diagnosis not present

## 2022-07-07 DIAGNOSIS — H524 Presbyopia: Secondary | ICD-10-CM | POA: Diagnosis not present

## 2022-07-15 DIAGNOSIS — Z23 Encounter for immunization: Secondary | ICD-10-CM | POA: Diagnosis not present

## 2023-01-02 DIAGNOSIS — H40023 Open angle with borderline findings, high risk, bilateral: Secondary | ICD-10-CM | POA: Diagnosis not present

## 2023-01-19 DIAGNOSIS — L259 Unspecified contact dermatitis, unspecified cause: Secondary | ICD-10-CM | POA: Diagnosis not present

## 2023-01-19 DIAGNOSIS — Z91038 Other insect allergy status: Secondary | ICD-10-CM | POA: Diagnosis not present

## 2023-01-19 DIAGNOSIS — Z0000 Encounter for general adult medical examination without abnormal findings: Secondary | ICD-10-CM | POA: Diagnosis not present

## 2023-01-19 DIAGNOSIS — R42 Dizziness and giddiness: Secondary | ICD-10-CM | POA: Diagnosis not present

## 2023-01-19 DIAGNOSIS — K119 Disease of salivary gland, unspecified: Secondary | ICD-10-CM | POA: Diagnosis not present

## 2023-01-19 DIAGNOSIS — Z86010 Personal history of colonic polyps: Secondary | ICD-10-CM | POA: Diagnosis not present

## 2023-01-19 DIAGNOSIS — E039 Hypothyroidism, unspecified: Secondary | ICD-10-CM | POA: Diagnosis not present

## 2023-01-19 DIAGNOSIS — E7800 Pure hypercholesterolemia, unspecified: Secondary | ICD-10-CM | POA: Diagnosis not present

## 2023-02-06 ENCOUNTER — Telehealth: Payer: Self-pay | Admitting: Internal Medicine

## 2023-02-06 NOTE — Telephone Encounter (Signed)
Good afternoon Dr. Leone Payor,   We received a call from this patient wishing to schedule a colonoscopy. Patient last had colonoscopy five years ago with Dr. Ewing Schlein at [facility]. She is wishing to transfer care over to you due to not feeling comforatble with previous provider and her husband was a patient of yours as well. Records were obtained from 2019 and have been scanned into [employer] for your review. Would you please advise on scheduling?  Thank you.

## 2023-02-11 ENCOUNTER — Encounter: Payer: Self-pay | Admitting: Internal Medicine

## 2023-02-11 DIAGNOSIS — Z86010 Personal history of colonic polyps: Secondary | ICD-10-CM | POA: Insufficient documentation

## 2023-02-11 NOTE — Telephone Encounter (Signed)
Mandy Johnson,   I am happy to accept her as a patient.  She may schedule a direct colonoscopy due to prior history of colon polyps.

## 2023-04-05 ENCOUNTER — Other Ambulatory Visit: Payer: Self-pay | Admitting: Family Medicine

## 2023-04-05 DIAGNOSIS — Z1231 Encounter for screening mammogram for malignant neoplasm of breast: Secondary | ICD-10-CM

## 2023-04-20 ENCOUNTER — Ambulatory Visit: Payer: Medicare Other

## 2023-05-02 ENCOUNTER — Ambulatory Visit
Admission: RE | Admit: 2023-05-02 | Discharge: 2023-05-02 | Disposition: A | Discharge status: Home / Self Care | Payer: Medicare Other | Source: Ambulatory Visit | Attending: Family Medicine | Admitting: Family Medicine

## 2023-05-02 DIAGNOSIS — Z1231 Encounter for screening mammogram for malignant neoplasm of breast: Secondary | ICD-10-CM

## 2023-06-23 DIAGNOSIS — Z23 Encounter for immunization: Secondary | ICD-10-CM | POA: Diagnosis not present

## 2023-09-24 ENCOUNTER — Encounter: Payer: Self-pay | Admitting: Internal Medicine

## 2023-10-04 DIAGNOSIS — H40013 Open angle with borderline findings, low risk, bilateral: Secondary | ICD-10-CM | POA: Diagnosis not present

## 2023-11-02 ENCOUNTER — Ambulatory Visit: Payer: Medicare Other

## 2023-11-02 VITALS — Ht 68.0 in | Wt 148.0 lb

## 2023-11-02 DIAGNOSIS — Z860100 Personal history of colon polyps, unspecified: Secondary | ICD-10-CM

## 2023-11-02 MED ORDER — SUFLAVE 178.7 G PO SOLR: 1.0000 | ORAL | 0 refills | Status: DC

## 2023-11-02 NOTE — Progress Notes (Signed)
 No egg or soy allergy known to patient  No issues known to pt with past sedation with any surgeries or procedures Patient denies ever being told they had issues or difficulty with intubation  No FH of Malignant Hyperthermia Pt is not on diet pills Pt is not on  home 02  Pt is not on blood thinners  Pt denies issues with constipation  No A fib or A flutter Have any cardiac testing pending-- independent  LOA: independent  Prep: suflave   Patient's chart reviewed by Cathlyn Parsons CNRA prior to previsit and patient appropriate for the [facility].  Previsit completed and red dot placed by patient's name on their procedure day (on provider's schedule).     PV completed with patient. Prep instructions sent to home address.

## 2023-11-15 NOTE — Progress Notes (Signed)
 [facility] History and Physical   Primary Care Physician:  Irven Coe, MD   Reason for Procedure:   Hx colon polyps  Plan:    colonoscopy     HPI: Mandy Johnson is a 73 y.o. female w/ hx of colon polyps as below:  2013 and 2019.  Diminutive cecal adenoma 2019.  Unknown in 2013.  Grandfather with colon cancer also. 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 Authorizing Provider  EPINEPHrine 0.3 mg/0.3 mL IJ SOAJ injection Inject 0.3 mg into the muscle as needed.    [provider]  levothyroxine (SYNTHROID, LEVOTHROID) 100 MCG tablet Take 100 mcg by mouth daily before breakfast.    [provider]  PEG 3350-KCl-NaCl-NaSulf-MgSul (SUFLAVE) 178.7 g SOLR Take 1 kit by mouth as directed. 11/02/23   Iva Boop, MD    Current Outpatient Medications  Medication Sig Dispense Refill   levothyroxine (SYNTHROID, LEVOTHROID) 100 MCG tablet Take 100 mcg by mouth daily before breakfast.     EPINEPHrine 0.3 mg/0.3 mL IJ SOAJ injection Inject 0.3 mg into the muscle as needed.     Current Facility-Administered Medications  Medication Dose Route Frequency Provider Last Rate Last Admin   0.9 %  sodium chloride infusion  500 mL Intravenous Once Iva Boop, MD        Allergies as of 11/16/2023 - Review Complete 11/16/2023  Allergen Reaction Noted   Amoxicillin Rash 11/02/2023   Iodine Rash 10/02/2016   Penicillins Rash 10/02/2016    Family History  Problem Relation Age of Onset   Cancer Mother    Heart disease Father    Colon cancer Neg Hx    Rectal cancer Neg Hx    Stomach cancer Neg Hx     Social History   Socioeconomic History   Marital status: Widowed     Spouse name: Not on file   Number of children: Not on file   Years of education: Not on file   Highest education level: Not on file  Occupational History   Not on file  Tobacco Use   Smoking status: Never   Smokeless tobacco: Never  Substance and Sexual Activity   Alcohol use: Yes   Drug use: No   Sexual activity: Not on file  Other Topics Concern   Not on file  Social History Narrative   Not on file   Social Drivers of Health   Financial Resource Strain: Not on file  Food Insecurity: Not on file  Transportation Needs: Not on file  Physical Activity: Not on file  Stress: Not on file  Social Connections: Not on file  Intimate Partner Violence: Not on file    Review of Systems:  All other review of systems negative except as mentioned in the HPI.  Physical Exam: Vital signs BP (!) 162/90   Pulse 78   Temp 97.7 F (36.5 C)   Ht 5\' 8"  (1.727 m)   Wt 148 lb (67.1 kg)   SpO2 100%   BMI 22.50 kg/m   General:   Alert,  Well-developed, well-nourished, pleasant and cooperative in NAD Lungs:  Clear throughout to auscultation.  Heart:  Regular rate and rhythm; no murmurs, clicks, rubs,  or gallops. Abdomen:  Soft, nontender and nondistended. Normal bowel sounds.   Neuro/Psych:  Alert and cooperative. Normal mood and affect. A and O x 3   @Myrtle Haller  Sena Slate, MD, [facility] [phone] (pager) 11/16/2023 11:57 AM@

## 2023-11-16 ENCOUNTER — Ambulatory Visit: Payer: Medicare Other | Admitting: Internal Medicine

## 2023-11-16 ENCOUNTER — Encounter: Payer: Self-pay | Admitting: Internal Medicine

## 2023-11-16 VITALS — BP 121/65 | HR 58 | Temp 97.7°F | Resp 10 | Ht 68.0 in | Wt 148.0 lb

## 2023-11-16 DIAGNOSIS — Z860101 Personal history of adenomatous and serrated colon polyps: Secondary | ICD-10-CM | POA: Diagnosis not present

## 2023-11-16 DIAGNOSIS — Z1211 Encounter for screening for malignant neoplasm of colon: Secondary | ICD-10-CM

## 2023-11-16 DIAGNOSIS — D123 Benign neoplasm of transverse colon: Secondary | ICD-10-CM | POA: Diagnosis not present

## 2023-11-16 DIAGNOSIS — K648 Other hemorrhoids: Secondary | ICD-10-CM

## 2023-11-16 DIAGNOSIS — K635 Polyp of colon: Secondary | ICD-10-CM | POA: Diagnosis not present

## 2023-11-16 DIAGNOSIS — Z860100 Personal history of colon polyps, unspecified: Secondary | ICD-10-CM

## 2023-11-16 MED ORDER — SODIUM CHLORIDE 0.9 % IV SOLN: 500.0000 mL | Freq: Once | INTRAVENOUS | Status: DC

## 2023-11-16 NOTE — Progress Notes (Signed)
 Pt's states no medical or surgical changes since previsit or office visit.

## 2023-11-16 NOTE — Op Note (Signed)
 [facility] Patient Name: Mandy Johnson Procedure Date: 11/16/2023 12:00 PM MRN: [id number] Endoscopist: Iva Boop , MD, [id number] Age: 73 Referring MD:  Date of Birth: 11-10-1950 Gender: Female Account #: [id number] Procedure:                Colonoscopy Indications:              Surveillance: Personal history of adenomatous                            polyps on last colonoscopy > 5 years ago, Last                            colonoscopy: 2019 Medicines:                Monitored Anesthesia Care Procedure:                Pre-Anesthesia Assessment:                           - Prior to the procedure, a History and Physical                            was performed, and patient medications and                            allergies were reviewed. The patient's tolerance of                            previous anesthesia was also reviewed. The risks                            and benefits of the procedure and the sedation                            options and risks were discussed with the patient.                            All questions were answered, and informed consent                            was obtained. Prior Anticoagulants: The patient has                            taken no anticoagulant or antiplatelet agents. ASA                            Grade Assessment: II - A patient with mild systemic                            disease. After reviewing the risks and benefits,                            the patient was deemed in satisfactory condition to  undergo the procedure.                           After obtaining informed consent, the colonoscope                            was passed under direct vision. Throughout the                            procedure, the patient's blood pressure, pulse, and                            oxygen saturations were monitored continuously. The                            Olympus Scope [id number] was introduced through  the                            anus and advanced to the the cecum, identified by                            appendiceal orifice and ileocecal valve. The                            colonoscopy was performed without difficulty. The                            patient tolerated the procedure well. The quality                            of the bowel preparation was good. The ileocecal                            valve, appendiceal orifice, and rectum were                            photographed. The bowel preparation used was                            SUFLAVE via split dose instruction. Scope In: 12:07:46 PM Scope Out: 12:31:24 PM Scope Withdrawal Time: 0 hours 18 minutes 50 seconds  Total Procedure Duration: 0 hours 23 minutes 38 seconds  Findings:                 The perianal and digital rectal examinations were                            normal.                           A 10 mm polyp was found in the proximal transverse                            colon. The polyp was sessile. The polyp was removed  with a cold snare. Resection and retrieval were                            complete. Verification of patient identification                            for the specimen was done. Estimated blood loss was                            minimal.                           Internal hemorrhoids were found.                           The exam was otherwise without abnormality on                            direct and retroflexion views. Complications:            No immediate complications. Estimated Blood Loss:     Estimated blood loss was minimal. Impression:               - One 10 mm polyp in the proximal transverse colon,                            removed with a cold snare. Resected and retrieved.                           - Internal hemorrhoids.                           - The examination was otherwise normal on direct                            and retroflexion views.                            - Personal history of colonic polyps. 2019                            diminutive adenoma, 2013 polyps also (do not have                            pathology, # size) Recommendation:           - Patient has a contact number available for                            emergencies. The signs and symptoms of potential                            delayed complications were discussed with the                            patient. Return to normal activities tomorrow.  Written discharge instructions were provided to the                            patient.                           - Resume previous diet.                           - Continue present medications.                           - Await pathology results.                           - No recommendation at this time regarding repeat                            colonoscopy due to age. Iva Boop, MD 11/16/2023 12:38:54 PM This report has been signed electronically.

## 2023-11-16 NOTE — Progress Notes (Signed)
 Vss nad trans to pacu

## 2023-11-16 NOTE — Patient Instructions (Addendum)
 One polyp found and removed.  I will have it analyzed and let you know what it was and whether to repeat a colonoscopy.   Your hemorrhoids were a bit swollen which is common when taking a prep for colonoscopy.  I appreciate the opportunity to care for you. Iva Boop, MD, [facility]  Handouts provided on polyps and hemorrhoids.  Resume previous diet.  Continue present medications.  Await pathology results.  No recommendation at this time regarding repeat colonoscopy due to age.   YOU HAD AN ENDOSCOPIC PROCEDURE TODAY AT THE [facility]:   Refer to the procedure report that was given to you for any specific questions about what was found during the examination.  If the procedure report does not answer your questions, please call your gastroenterologist to clarify.  If you requested that your care partner not be given the details of your procedure findings, then the procedure report has been included in a sealed envelope for you to review at your convenience later.  YOU SHOULD EXPECT: Some feelings of bloating in the abdomen. Passage of more gas than usual.  Walking can help get rid of the air that was put into your GI tract during the procedure and reduce the bloating. If you had a lower endoscopy (such as a colonoscopy or flexible sigmoidoscopy) you may notice spotting of blood in your stool or on the toilet paper. If you underwent a bowel prep for your procedure, you may not have a normal bowel movement for a few days.  Please Note:  You might notice some irritation and congestion in your nose or some drainage.  This is from the oxygen used during your procedure.  There is no need for concern and it should clear up in a day or so.  SYMPTOMS TO REPORT IMMEDIATELY:  Following lower endoscopy (colonoscopy or flexible sigmoidoscopy):  Excessive amounts of blood in the stool  Significant tenderness or worsening of abdominal pains  Swelling of the abdomen that is new, acute  Fever  of 100F or higher  For urgent or emergent issues, a gastroenterologist can be reached at any hour by calling [phone]. Do not use MyChart messaging for urgent concerns.    DIET:  We do recommend a small meal at first, but then you may proceed to your regular diet.  Drink plenty of fluids but you should avoid alcoholic beverages for 24 hours.  ACTIVITY:  You should plan to take it easy for the rest of today and you should NOT DRIVE or use heavy machinery until tomorrow (because of the sedation medicines used during the test).    FOLLOW UP: Our staff will call the number listed on your records the next business day following your procedure.  We will call around 7:15- 8:00 am to check on you and address any questions or concerns that you may have regarding the information given to you following your procedure. If we do not reach you, we will leave a message.     If any biopsies were taken you will be contacted by phone or by letter within the next 1-3 weeks.  Please call us at [phone] if you have not heard about the biopsies in 3 weeks.    SIGNATURES/CONFIDENTIALITY: You and/or your care partner have signed paperwork which will be entered into your electronic medical record.  These signatures attest to the fact that that the information above on your After Visit Summary has been reviewed and is understood.  Full responsibility  of the confidentiality of this discharge information lies with you and/or your care-partner.

## 2023-11-16 NOTE — Progress Notes (Signed)
 Called to room to assist during endoscopic procedure.  Patient ID and intended procedure confirmed with present staff. Received instructions for my participation in the procedure from the performing physician.

## 2023-11-19 ENCOUNTER — Telehealth: Payer: Self-pay

## 2023-11-19 NOTE — Telephone Encounter (Signed)
  Follow up Call-     11/16/2023   11:20 AM  Call back number  Post procedure Call Back phone  # [phone]  Permission to leave phone message Yes     Patient questions:  Do you have a fever, pain , or abdominal swelling? No. Pain Score  0 *  Have you tolerated food without any problems? Yes.    Have you been able to return to your normal activities? Yes.    Do you have any questions about your discharge instructions: Diet   No. Medications  No. Follow up visit  No.  Do you have questions or concerns about your Care? No.  Actions: * If pain score is 4 or above: No action needed, pain <4.

## 2023-11-21 LAB — SURGICAL PATHOLOGY

## 2023-11-23 ENCOUNTER — Encounter: Payer: Self-pay | Admitting: Internal Medicine

## 2023-11-23 DIAGNOSIS — Z860101 Personal history of adenomatous and serrated colon polyps: Secondary | ICD-10-CM

## 2024-01-01 DIAGNOSIS — D224 Melanocytic nevi of scalp and neck: Secondary | ICD-10-CM | POA: Diagnosis not present

## 2024-01-22 DIAGNOSIS — E7800 Pure hypercholesterolemia, unspecified: Secondary | ICD-10-CM | POA: Diagnosis not present

## 2024-01-22 DIAGNOSIS — E039 Hypothyroidism, unspecified: Secondary | ICD-10-CM | POA: Diagnosis not present

## 2024-01-22 DIAGNOSIS — Z130 Encounter for screening for diseases of the blood and blood-forming organs and certain disorders involving the immune mechanism: Secondary | ICD-10-CM | POA: Diagnosis not present

## 2024-01-25 DIAGNOSIS — Z0000 Encounter for general adult medical examination without abnormal findings: Secondary | ICD-10-CM | POA: Diagnosis not present

## 2024-01-25 DIAGNOSIS — E039 Hypothyroidism, unspecified: Secondary | ICD-10-CM | POA: Diagnosis not present

## 2024-01-25 DIAGNOSIS — Z130 Encounter for screening for diseases of the blood and blood-forming organs and certain disorders involving the immune mechanism: Secondary | ICD-10-CM | POA: Diagnosis not present

## 2024-01-25 DIAGNOSIS — E7800 Pure hypercholesterolemia, unspecified: Secondary | ICD-10-CM | POA: Diagnosis not present

## 2024-01-25 DIAGNOSIS — M8588 Other specified disorders of bone density and structure, other site: Secondary | ICD-10-CM | POA: Diagnosis not present

## 2024-02-11 ENCOUNTER — Other Ambulatory Visit: Payer: Self-pay | Admitting: Family Medicine

## 2024-02-11 DIAGNOSIS — Z1231 Encounter for screening mammogram for malignant neoplasm of breast: Secondary | ICD-10-CM

## 2024-03-04 ENCOUNTER — Other Ambulatory Visit (HOSPITAL_BASED_OUTPATIENT_CLINIC_OR_DEPARTMENT_OTHER): Payer: Self-pay | Admitting: Family Medicine

## 2024-03-04 DIAGNOSIS — M8588 Other specified disorders of bone density and structure, other site: Secondary | ICD-10-CM

## 2024-04-04 DIAGNOSIS — H40013 Open angle with borderline findings, low risk, bilateral: Secondary | ICD-10-CM | POA: Diagnosis not present

## 2024-04-04 DIAGNOSIS — H25042 Posterior subcapsular polar age-related cataract, left eye: Secondary | ICD-10-CM | POA: Diagnosis not present

## 2024-05-08 ENCOUNTER — Ambulatory Visit
Admission: RE | Admit: 2024-05-08 | Discharge: 2024-05-08 | Disposition: A | Discharge status: Home / Self Care | Source: Ambulatory Visit | Attending: Family Medicine | Admitting: Family Medicine

## 2024-05-08 DIAGNOSIS — Z1231 Encounter for screening mammogram for malignant neoplasm of breast: Secondary | ICD-10-CM

## 2024-05-24 DIAGNOSIS — Z23 Encounter for immunization: Secondary | ICD-10-CM | POA: Diagnosis not present
